# Patient Record
Sex: Female | Born: 1945 | Race: White | Hispanic: No | Smoking: Never smoker
Health system: Southern US, Community
[De-identification: ages and names within clinical notes are randomized; demographics above are authoritative.]

## PROBLEM LIST (undated history)

## (undated) DIAGNOSIS — Z972 Presence of dental prosthetic device (complete) (partial): Secondary | ICD-10-CM

## (undated) DIAGNOSIS — K8020 Calculus of gallbladder without cholecystitis without obstruction: Secondary | ICD-10-CM

## (undated) DIAGNOSIS — K219 Gastro-esophageal reflux disease without esophagitis: Secondary | ICD-10-CM

## (undated) HISTORY — DX: Calculus of gallbladder without cholecystitis without obstruction: K80.20

---

## 1987-07-01 HISTORY — PX: MASTECTOMY: SHX3

## 2015-10-14 ENCOUNTER — Emergency Department: Payer: Medicare Other

## 2015-10-14 ENCOUNTER — Emergency Department
Admission: EM | Admit: 2015-10-14 | Discharge: 2015-10-14 | Disposition: A | Discharge status: Home / Self Care | Payer: Medicare Other | Attending: Emergency Medicine | Admitting: Emergency Medicine

## 2015-10-14 DIAGNOSIS — R112 Nausea with vomiting, unspecified: Secondary | ICD-10-CM | POA: Insufficient documentation

## 2015-10-14 DIAGNOSIS — R1013 Epigastric pain: Secondary | ICD-10-CM | POA: Diagnosis not present

## 2015-10-14 DIAGNOSIS — Z9010 Acquired absence of unspecified breast and nipple: Secondary | ICD-10-CM | POA: Insufficient documentation

## 2015-10-14 DIAGNOSIS — R079 Chest pain, unspecified: Secondary | ICD-10-CM | POA: Diagnosis present

## 2015-10-14 DIAGNOSIS — K8020 Calculus of gallbladder without cholecystitis without obstruction: Secondary | ICD-10-CM | POA: Diagnosis not present

## 2015-10-14 DIAGNOSIS — Z859 Personal history of malignant neoplasm, unspecified: Secondary | ICD-10-CM | POA: Diagnosis not present

## 2015-10-14 LAB — COMPREHENSIVE METABOLIC PANEL
ALK PHOS: 57 U/L (ref 38–126)
ALT: 15 U/L (ref 14–54)
AST: 20 U/L (ref 15–41)
Albumin: 4.5 g/dL (ref 3.5–5.0)
Anion gap: 9 (ref 5–15)
BILIRUBIN TOTAL: 0.6 mg/dL (ref 0.3–1.2)
BUN: 17 mg/dL (ref 6–20)
CALCIUM: 9.4 mg/dL (ref 8.9–10.3)
CHLORIDE: 101 mmol/L (ref 101–111)
CO2: 26 mmol/L (ref 22–32)
CREATININE: 1.15 mg/dL — AB (ref 0.44–1.00)
GFR, EST AFRICAN AMERICAN: 55 mL/min — AB (ref >60)
GFR, EST NON AFRICAN AMERICAN: 47 mL/min — AB (ref >60)
Glucose, Bld: 116 mg/dL — ABNORMAL HIGH (ref 65–99)
Potassium: 3.6 mmol/L (ref 3.5–5.1)
Sodium: 136 mmol/L (ref 135–145)
Total Protein: 7.1 g/dL (ref 6.5–8.1)

## 2015-10-14 LAB — CBC WITH DIFFERENTIAL/PLATELET
BASOS ABS: 0.1 10*3/uL (ref 0–0.1)
BASOS PCT: 1 %
Eosinophils Absolute: 0.1 10*3/uL (ref 0–0.7)
Eosinophils Relative: 1 %
HEMATOCRIT: 41.1 % (ref 35.0–47.0)
HEMOGLOBIN: 13.7 g/dL (ref 12.0–16.0)
LYMPHS PCT: 14 %
Lymphs Abs: 1.5 10*3/uL (ref 1.0–3.6)
MCH: 27.6 pg (ref 26.0–34.0)
MCHC: 33.4 g/dL (ref 32.0–36.0)
MCV: 82.8 fL (ref 80.0–100.0)
Monocytes Absolute: 0.7 10*3/uL (ref 0.2–0.9)
Monocytes Relative: 7 %
NEUTROS ABS: 8.6 10*3/uL — AB (ref 1.4–6.5)
NEUTROS PCT: 77 %
Platelets: 263 10*3/uL (ref 150–440)
RBC: 4.96 MIL/uL (ref 3.80–5.20)
RDW: 13.8 % (ref 11.5–14.5)
WBC: 10.9 10*3/uL (ref 3.6–11.0)

## 2015-10-14 LAB — TROPONIN I

## 2015-10-14 LAB — LIPASE, BLOOD: LIPASE: 34 U/L (ref 11–51)

## 2015-10-14 MED ORDER — BACITRACIN ZINC 500 UNIT/GM EX OINT
TOPICAL_OINTMENT | CUTANEOUS | Status: AC
  Filled 2015-10-14: qty 0.9

## 2015-10-14 MED ORDER — FAMOTIDINE IN NACL 20-0.9 MG/50ML-% IV SOLN
20.0000 mg | Freq: Once | INTRAVENOUS | Status: AC
  Administered 2015-10-14: 20 mg via INTRAVENOUS
  Filled 2015-10-14: qty 50

## 2015-10-14 MED ORDER — ONDANSETRON HCL 4 MG/2ML IJ SOLN
4.0000 mg | Freq: Once | INTRAMUSCULAR | Status: AC
  Administered 2015-10-14: 4 mg via INTRAVENOUS
  Filled 2015-10-14: qty 2

## 2015-10-14 MED ORDER — MORPHINE SULFATE (PF) 2 MG/ML IV SOLN
2.0000 mg | Freq: Once | INTRAVENOUS | Status: AC
  Administered 2015-10-14: 2 mg via INTRAVENOUS
  Filled 2015-10-14: qty 1

## 2015-10-14 MED ORDER — OXYCODONE-ACETAMINOPHEN 5-325 MG PO TABS: 1.0000 | ORAL_TABLET | ORAL | Status: DC | PRN

## 2015-10-14 MED ORDER — ONDANSETRON 4 MG PO TBDP: 4.0000 mg | ORAL_TABLET | Freq: Three times a day (TID) | ORAL | Status: DC | PRN

## 2015-10-14 MED ORDER — OXYCODONE-ACETAMINOPHEN 5-325 MG PO TABS
1.0000 | ORAL_TABLET | Freq: Once | ORAL | Status: AC
  Administered 2015-10-14: 1 via ORAL
  Filled 2015-10-14: qty 1

## 2015-10-14 MED ORDER — ONDANSETRON 4 MG PO TBDP
4.0000 mg | ORAL_TABLET | Freq: Once | ORAL | Status: AC
  Administered 2015-10-14: 4 mg via ORAL
  Filled 2015-10-14: qty 1

## 2015-10-14 NOTE — Discharge Instructions (Signed)
1. Take medicines as needed for pain & nausea (Percocet/Zofran #20). 2. Clear liquids x 12 hours, then bland diet x 1 week, then slowly advance diet as tolerated. Avoid fatty, greasy, spicy foods and drinks. 3. Return to the ER for worsening symptoms, persistent vomiting, fever, difficulty breathing or other concerns.  Abdominal Pain, Adult Many things can cause abdominal pain. Usually, abdominal pain is not caused by a disease and will improve without treatment. It can often be observed and treated at home. Your health care provider will do a physical exam and possibly order blood tests and X-rays to help determine the seriousness of your pain. However, in many cases, more time must pass before a clear cause of the pain can be found. Before that point, your health care provider may not know if you need more testing or further treatment. HOME CARE INSTRUCTIONS Monitor your abdominal pain for any changes. The following actions may help to alleviate any discomfort you are experiencing:  Only take over-the-counter or prescription medicines as directed by your health care provider.  Do not take laxatives unless directed to do so by your health care provider.  Try a clear liquid diet (broth, tea, or water) as directed by your health care provider. Slowly move to a bland diet as tolerated. SEEK MEDICAL CARE IF:  You have unexplained abdominal pain.  You have abdominal pain associated with nausea or diarrhea.  You have pain when you urinate or have a bowel movement.  You experience abdominal pain that wakes you in the night.  You have abdominal pain that is worsened or improved by eating food.  You have abdominal pain that is worsened with eating fatty foods.  You have a fever. SEEK IMMEDIATE MEDICAL CARE IF:  Your pain does not go away within 2 hours.  You keep throwing up (vomiting).  Your pain is felt only in portions of the abdomen, such as the right side or the left lower portion of  the abdomen.  You pass bloody or black tarry stools. MAKE SURE YOU:  Understand these instructions.  Will watch your condition.  Will get help right away if you are not doing well or get worse.   This information is not intended to replace advice given to you by your health care provider. Make sure you discuss any questions you have with your health care provider.   Document Released: 03/26/2005 Document Revised: 03/07/2015 Document Reviewed: 02/23/2013 Elsevier Interactive Patient Education 2016 Elsevier Inc.  Biliary Colic Biliary colic is a pain in the upper abdomen. The pain:  Is usually felt on the right side of the abdomen, but it may also be felt in the center of the abdomen, just below the breastbone (sternum).  May spread back toward the right shoulder blade.  May be steady or irregular.  May be accompanied by nausea and vomiting. Most of the time, the pain goes away in 1-5 hours. After the most intense pain passes, the abdomen may continue to ache mildly for about 24 hours. Biliary colic is caused by a blockage in the bile duct. The bile duct is a pathway that carries bile--a liquid that helps to digest fats--from the gallbladder to the small intestine. Biliary colic usually occurs after eating, when the digestive system demands bile. The pain develops when muscle cells contract forcefully to try to move the blockage so that bile can get by. HOME CARE INSTRUCTIONS  Take medicines only as directed by your health care provider.  Drink enough fluid to  keep your urine clear or pale yellow.  Avoid fatty, greasy, and fried foods. These kinds of foods increase your body's demand for bile.  Avoid any foods that make your pain worse.  Avoid overeating.  Avoid having a large meal after fasting. SEEK MEDICAL CARE IF:  You develop a fever.  Your pain gets worse.  You vomit.  You develop nausea that prevents you from eating and drinking. SEEK IMMEDIATE MEDICAL CARE  IF:  You suddenly develop a fever and shaking chills.  You develop a yellowish discoloration (jaundice) of:  Skin.  Whites of the eyes.  Mucous membranes.  You have continuous or severe pain that is not relieved with medicines.  You have nausea and vomiting that is not relieved with medicines.  You develop dizziness or you faint.   This information is not intended to replace advice given to you by your health care provider. Make sure you discuss any questions you have with your health care provider.   Document Released: 11/17/2005 Document Revised: 10/31/2014 Document Reviewed: 03/28/2014 Elsevier Interactive Patient Education 2016 [employer].  Cholelithiasis Cholelithiasis (also called gallstones) is a form of gallbladder disease in which gallstones form in your gallbladder. The gallbladder is an organ that stores bile made in the liver, which helps digest fats. Gallstones begin as small crystals and slowly grow into stones. Gallstone pain occurs when the gallbladder spasms and a gallstone is blocking the duct. Pain can also occur when a stone passes out of the duct.  RISK FACTORS  Being female.   Having multiple pregnancies. Health care providers sometimes advise removing diseased gallbladders before future pregnancies.   Being obese.  Eating a diet heavy in fried foods and fat.   Being older than 60 years and increasing age.   Prolonged use of medicines containing female hormones.   Having diabetes mellitus.   Rapidly losing weight.   Having a family history of gallstones (heredity).  SYMPTOMS  Nausea.   Vomiting.  Abdominal pain.   Yellowing of the skin (jaundice).   Sudden pain. It may persist from several minutes to several hours.  Fever.   Tenderness to the touch. In some cases, when gallstones do not move into the bile duct, people have no pain or symptoms. These are called "silent" gallstones.  TREATMENT Silent gallstones do not  need treatment. In severe cases, emergency surgery may be required. Options for treatment include:  Surgery to remove the gallbladder. This is the most common treatment.  Medicines. These do not always work and may take 6-12 months or more to work.  Shock wave treatment (extracorporeal biliary lithotripsy). In this treatment an ultrasound machine sends shock waves to the gallbladder to break gallstones into smaller pieces that can pass into the intestines or be dissolved by medicine. HOME CARE INSTRUCTIONS   Only take over-the-counter or prescription medicines for pain, discomfort, or fever as directed by your health care provider.   Follow a low-fat diet until seen again by your health care provider. Fat causes the gallbladder to contract, which can result in pain.   Follow up with your health care provider as directed. Attacks are almost always recurrent and surgery is usually required for permanent treatment.  SEEK IMMEDIATE MEDICAL CARE IF:   Your pain increases and is not controlled by medicines.   You have a fever or persistent symptoms for more than 2-3 days.   You have a fever and your symptoms suddenly get worse.   You have persistent nausea and vomiting.  MAKE SURE YOU:   Understand these instructions.  Will watch your condition.  Will get help right away if you are not doing well or get worse.   This information is not intended to replace advice given to you by your health care provider. Make sure you discuss any questions you have with your health care provider.   Document Released: 06/12/2005 Document Revised: 02/16/2013 Document Reviewed: 12/08/2012 Elsevier Interactive Patient Education 2016 Elsevier Inc.  Nausea and Vomiting Nausea is a sick feeling that often comes before throwing up (vomiting). Vomiting is a reflex where stomach contents come out of your mouth. Vomiting can cause severe loss of body fluids (dehydration). Children and elderly adults can  become dehydrated quickly, especially if they also have diarrhea. Nausea and vomiting are symptoms of a condition or disease. It is important to find the cause of your symptoms. CAUSES   Direct irritation of the stomach lining. This irritation can result from increased acid production (gastroesophageal reflux disease), infection, food poisoning, taking certain medicines (such as nonsteroidal anti-inflammatory drugs), alcohol use, or tobacco use.  Signals from the brain.These signals could be caused by a headache, heat exposure, an inner ear disturbance, increased pressure in the brain from injury, infection, a tumor, or a concussion, pain, emotional stimulus, or metabolic problems.  An obstruction in the gastrointestinal tract (bowel obstruction).  Illnesses such as diabetes, hepatitis, gallbladder problems, appendicitis, kidney problems, cancer, sepsis, atypical symptoms of a heart attack, or eating disorders.  Medical treatments such as chemotherapy and radiation.  Receiving medicine that makes you sleep (general anesthetic) during surgery. DIAGNOSIS Your caregiver may ask for tests to be done if the problems do not improve after a few days. Tests may also be done if symptoms are severe or if the reason for the nausea and vomiting is not clear. Tests may include:  Urine tests.  Blood tests.  Stool tests.  Cultures (to look for evidence of infection).  X-rays or other imaging studies. Test results can help your caregiver make decisions about treatment or the need for additional tests. TREATMENT You need to stay well hydrated. Drink frequently but in small amounts.You may wish to drink water, sports drinks, clear broth, or eat frozen ice pops or gelatin dessert to help stay hydrated.When you eat, eating slowly may help prevent nausea.There are also some antinausea medicines that may help prevent nausea. HOME CARE INSTRUCTIONS   Take all medicine as directed by your caregiver.  If  you do not have an appetite, do not force yourself to eat. However, you must continue to drink fluids.  If you have an appetite, eat a normal diet unless your caregiver tells you differently.  Eat a variety of complex carbohydrates (rice, wheat, potatoes, bread), lean meats, yogurt, fruits, and vegetables.  Avoid high-fat foods because they are more difficult to digest.  Drink enough water and fluids to keep your urine clear or pale yellow.  If you are dehydrated, ask your caregiver for specific rehydration instructions. Signs of dehydration may include:  Severe thirst.  Dry lips and mouth.  Dizziness.  Dark urine.  Decreasing urine frequency and amount.  Confusion.  Rapid breathing or pulse. SEEK IMMEDIATE MEDICAL CARE IF:   You have blood or brown flecks (like coffee grounds) in your vomit.  You have black or bloody stools.  You have a severe headache or stiff neck.  You are confused.  You have severe abdominal pain.  You have chest pain or trouble breathing.  You do not  urinate at least once every 8 hours.  You develop cold or clammy skin.  You continue to vomit for longer than 24 to 48 hours.  You have a fever. MAKE SURE YOU:   Understand these instructions.  Will watch your condition.  Will get help right away if you are not doing well or get worse.   This information is not intended to replace advice given to you by your health care provider. Make sure you discuss any questions you have with your health care provider.   Document Released: 06/16/2005 Document Revised: 09/08/2011 Document Reviewed: 11/13/2010 Elsevier Interactive Patient Education [employer]2016 Elsevier Inc.

## 2015-10-14 NOTE — ED Provider Notes (Signed)
[facility] Emergency Department Provider Note  ____________________________________________  Time seen: Approximately 1:38 AM  I have reviewed the triage vital signs and the nursing notes.   HISTORY  Chief Complaint Chest Pain    HPI Caitlyn Miranda is a 70 y.o. female who presents to the ED from home via EMS with a chief complain of chest pain. Patient was awake and cooking Easter dinner; had eaten chicken fried steak and experienced sudden onset of substernal chest/epigastric burning sensation approximately one hour prior to arrival. This was accompanied by 5 episodes of emesis. No associated symptoms of diaphoresis, shortness of breath, palpitations, dizziness or diarrhea. Denies recent fever, chills, cough, congestion, dysuria. Denies recent travel or trauma. EMS reports arrival BP was 220/102. EMS administered 2 sublingual nitroglycerin which brought patient's blood pressure down to 148/92. Nothing makes patient's pain better or worse.   Past medical history Breast tumor  There are no active problems to display for this patient.   Past Surgical History  Procedure Laterality Date  . Mastectomy Right     No current outpatient prescriptions on file.  Allergies Review of patient's allergies indicates no known allergies.  History reviewed. No pertinent family history.  Social History Social History  Substance Use Topics  . Smoking status: Never Smoker   . Smokeless tobacco: Never Used  . Alcohol Use: Yes     Comment: rarely    Review of Systems  Constitutional: No fever/chills. Eyes: No visual changes. ENT: No sore throat. Cardiovascular: Positive for chest pain. Respiratory: Denies shortness of breath. Gastrointestinal: Positive for abdominal pain, nausea and vomiting.  No diarrhea.  No constipation. Genitourinary: Negative for dysuria. Musculoskeletal: Negative for back pain. Skin: Negative for rash. Neurological: Negative for  headaches, focal weakness or numbness.  10-point ROS otherwise negative.  ____________________________________________   PHYSICAL EXAM:  VITAL SIGNS: ED Triage Vitals  Enc Vitals Group     BP 10/14/15 0124 155/79 mmHg     Pulse Rate 10/14/15 0124 75     Resp 10/14/15 0124 13     Temp 10/14/15 0124 97.8 F (36.6 C)     Temp Source 10/14/15 0124 Oral     SpO2 10/14/15 0117 95 %     Weight 10/14/15 0124 176 lb (79.833 kg)     Height 10/14/15 0124 5\' 3"  (1.6 m)     Head Cir --      Peak Flow --      Pain Score 10/14/15 0126 6     Pain Loc --      Pain Edu? --      Excl. in GC? --     Constitutional: Alert and oriented. Well appearing and in no acute distress. Eyes: Conjunctivae are normal. PERRL. EOMI. Head: Atraumatic. Nose: No congestion/rhinnorhea. Mouth/Throat: Mucous membranes are moist.  Oropharynx non-erythematous. Neck: No stridor.   Cardiovascular: Normal rate, regular rhythm. Grossly normal heart sounds.  Good peripheral circulation. Respiratory: Normal respiratory effort.  No retractions. Lungs CTAB. Gastrointestinal: Soft and mildly tender to palpation epigastrium without rebound or guarding. No distention. No abdominal bruits. No CVA tenderness. Musculoskeletal: No lower extremity tenderness nor edema.  No joint effusions. Neurologic:  Normal speech and language. No gross focal neurologic deficits are appreciated. No gait instability. Skin:  Skin is warm, dry and intact. No rash noted. Psychiatric: Mood and affect are normal. Speech and behavior are normal.  ____________________________________________   LABS (all labs ordered are listed, but only abnormal results are displayed)  Labs Reviewed  CBC WITH DIFFERENTIAL/PLATELET - Abnormal; Notable for the following:    Neutro Abs 8.6 (*)    All other components within normal limits  COMPREHENSIVE METABOLIC PANEL - Abnormal; Notable for the following:    Glucose, Bld 116 (*)    Creatinine, Ser 1.15 (*)     GFR calc non Af Amer 47 (*)    GFR calc Af Amer 55 (*)    All other components within normal limits  TROPONIN I  LIPASE, BLOOD   ____________________________________________  EKG  ED ECG REPORT I, Silverio Hagan J, the attending physician, personally viewed and interpreted this ECG.   Date: 10/14/2015  EKG Time: 0122  Rate: 80  Rhythm: normal EKG, normal sinus rhythm  Axis: Normal  Intervals:none  ST&T Change: Nonspecific  ____________________________________________  RADIOLOGY  Portable CXR (viewed by me, interpreted per Dr. Cherly Hensen): Question of COPD. Mild peribronchial thickening noted. Minimal bibasilar atelectasis seen. Borderline cardiomegaly.  US Abdomen Limited interpreted per Dr. Clovis Riley: Increased hepatic echogenicity, suggesting fatty infiltration. Cholelithiasis. ____________________________________________   PROCEDURES  Procedure(s) performed: None  Critical Care performed: No  ____________________________________________   INITIAL IMPRESSION / ASSESSMENT AND PLAN / ED COURSE  Pertinent labs & imaging results that were available during my care of the patient were reviewed by me and considered in my medical decision making (see chart for details).  70 year old female who presents with substernal chest/epigastric burning discomfort associated with emesis after eating chicken fried steak. Will obtain screening lab work including LFTs and lipase; administer IV analgesia and antiemetic, and obtain right upper quadrant ultrasound to evaluate biliary tree.  ----------------------------------------- 4:01 AM on 10/14/2015 -----------------------------------------  Pain improved after morphine. Updated patient and family members of ultrasound results for cholelithiasis. Plan for analgesia, antiemetic and outpatient follow-up with surgery. Strict return precautions given. All verbalize understanding and agree with plan of  care. ____________________________________________   FINAL CLINICAL IMPRESSION(S) / ED DIAGNOSES  Final diagnoses:  Epigastric pain  Non-intractable vomiting with nausea, vomiting of unspecified type  Calculus of gallbladder without cholecystitis without obstruction      Irean Hong, MD 10/14/15 [phone]

## 2015-10-14 NOTE — ED Notes (Signed)
Patient transported to Ultrasound 

## 2015-10-14 NOTE — ED Notes (Signed)
Patient arrives via EMS with complaints of chest pain in the substernal area.  EMS reports original BP of 220/102 then treated with 2 nitro. Pressure after nitro was 148/92. Reports that pain in now more in the epigasric area. Not diaphoretic. Patient states that she took an [employer] seltzer with no relief.

## 2015-10-14 NOTE — ED Notes (Signed)
Patient stable and ambulatory. Verbalized understanding of the discharge instructions.   

## 2017-07-23 DIAGNOSIS — R69 Illness, unspecified: Secondary | ICD-10-CM | POA: Diagnosis not present

## 2017-07-30 DIAGNOSIS — R69 Illness, unspecified: Secondary | ICD-10-CM | POA: Diagnosis not present

## 2017-08-10 DIAGNOSIS — R69 Illness, unspecified: Secondary | ICD-10-CM | POA: Diagnosis not present

## 2017-08-11 DIAGNOSIS — R69 Illness, unspecified: Secondary | ICD-10-CM | POA: Diagnosis not present

## 2017-08-20 DIAGNOSIS — R69 Illness, unspecified: Secondary | ICD-10-CM | POA: Diagnosis not present

## 2017-08-24 ENCOUNTER — Encounter: Payer: Self-pay | Admitting: Unknown Physician Specialty

## 2017-08-24 ENCOUNTER — Ambulatory Visit (INDEPENDENT_AMBULATORY_CARE_PROVIDER_SITE_OTHER): Payer: Medicare HMO | Admitting: Unknown Physician Specialty

## 2017-08-24 DIAGNOSIS — H6123 Impacted cerumen, bilateral: Secondary | ICD-10-CM | POA: Insufficient documentation

## 2017-08-24 DIAGNOSIS — I10 Essential (primary) hypertension: Secondary | ICD-10-CM

## 2017-08-24 DIAGNOSIS — J301 Allergic rhinitis due to pollen: Secondary | ICD-10-CM | POA: Diagnosis not present

## 2017-08-24 DIAGNOSIS — L658 Other specified nonscarring hair loss: Secondary | ICD-10-CM | POA: Insufficient documentation

## 2017-08-24 DIAGNOSIS — J309 Allergic rhinitis, unspecified: Secondary | ICD-10-CM | POA: Insufficient documentation

## 2017-08-24 MED ORDER — FLUTICASONE PROPIONATE 50 MCG/ACT NA SUSP: 2.0000 | Freq: Every day | NASAL | 6 refills | Status: DC

## 2017-08-24 NOTE — Patient Instructions (Addendum)
DASH Eating Plan DASH stands for "Dietary Approaches to Stop Hypertension." The DASH eating plan is a healthy eating plan that has been shown to reduce high blood pressure (hypertension). It may also reduce your risk for type 2 diabetes, heart disease, and stroke. The DASH eating plan may also help with weight loss. What are tips for following this plan? General guidelines  Avoid eating more than 2,300 mg (milligrams) of salt (sodium) a day. If you have hypertension, you may need to reduce your sodium intake to 1,500 mg a day.  Limit alcohol intake to no more than 1 drink a day for nonpregnant women and 2 drinks a day for men. One drink equals 12 oz of beer, 5 oz of wine, or 1 oz of hard liquor.  Work with your health care provider to maintain a healthy body weight or to lose weight. Ask what an ideal weight is for you.  Get at least 30 minutes of exercise that causes your heart to beat faster (aerobic exercise) most days of the week. Activities may include walking, swimming, or biking.  Work with your health care provider or diet and nutrition specialist (dietitian) to adjust your eating plan to your individual calorie needs. Reading food labels  Check food labels for the amount of sodium per serving. Choose foods with less than 5 percent of the Daily Value of sodium. Generally, foods with less than 300 mg of sodium per serving fit into this eating plan.  To find whole grains, look for the word "whole" as the first word in the ingredient list. Shopping  Buy products labeled as "low-sodium" or "no salt added."  Buy fresh foods. Avoid canned foods and premade or frozen meals. Cooking  Avoid adding salt when cooking. Use salt-free seasonings or herbs instead of table salt or sea salt. Check with your health care provider or pharmacist before using salt substitutes.  Do not fry foods. Cook foods using healthy methods such as baking, boiling, grilling, and broiling instead.  Cook with  heart-healthy oils, such as olive, canola, soybean, or sunflower oil. Meal planning   Eat a balanced diet that includes: ? 5 or more servings of fruits and vegetables each day. At each meal, try to fill half of your plate with fruits and vegetables. ? Up to 6-8 servings of whole grains each day. ? Less than 6 oz of lean meat, poultry, or fish each day. A 3-oz serving of meat is about the same size as a deck of cards. One egg equals 1 oz. ? 2 servings of low-fat dairy each day. ? A serving of nuts, seeds, or beans 5 times each week. ? Heart-healthy fats. Healthy fats called Omega-3 fatty acids are found in foods such as flaxseeds and coldwater fish, like sardines, salmon, and mackerel.  Limit how much you eat of the following: ? Canned or prepackaged foods. ? Food that is high in trans fat, such as fried foods. ? Food that is high in saturated fat, such as fatty meat. ? Sweets, desserts, sugary drinks, and other foods with added sugar. ? Full-fat dairy products.  Do not salt foods before eating.  Try to eat at least 2 vegetarian meals each week.  Eat more home-cooked food and less restaurant, buffet, and fast food.  When eating at a restaurant, ask that your food be prepared with less salt or no salt, if possible. What foods are recommended? The items listed may not be a complete list. Talk with your dietitian about what   dietary choices are best for you. Grains Whole-grain or whole-wheat bread. Whole-grain or whole-wheat pasta. Brown rice. Oatmeal. Quinoa. Bulgur. Whole-grain and low-sodium cereals. Pita bread. Low-fat, low-sodium crackers. Whole-wheat flour tortillas. Vegetables Fresh or frozen vegetables (raw, steamed, roasted, or grilled). Low-sodium or reduced-sodium tomato and vegetable juice. Low-sodium or reduced-sodium tomato sauce and tomato paste. Low-sodium or reduced-sodium canned vegetables. Fruits All fresh, dried, or frozen fruit. Canned fruit in natural juice (without  added sugar). Meat and other protein foods Skinless chicken or turkey. Ground chicken or turkey. Pork with fat trimmed off. Fish and seafood. Egg whites. Dried beans, peas, or lentils. Unsalted nuts, nut butters, and seeds. Unsalted canned beans. Lean cuts of beef with fat trimmed off. Low-sodium, lean deli meat. Dairy Low-fat (1%) or fat-free (skim) milk. Fat-free, low-fat, or reduced-fat cheeses. Nonfat, low-sodium ricotta or cottage cheese. Low-fat or nonfat yogurt. Low-fat, low-sodium cheese. Fats and oils Soft margarine without trans fats. Vegetable oil. Low-fat, reduced-fat, or light mayonnaise and salad dressings (reduced-sodium). Canola, safflower, olive, soybean, and sunflower oils. Avocado. Seasoning and other foods Herbs. Spices. Seasoning mixes without salt. Unsalted popcorn and pretzels. Fat-free sweets. What foods are not recommended? The items listed may not be a complete list. Talk with your dietitian about what dietary choices are best for you. Grains Baked goods made with fat, such as croissants, muffins, or some breads. Dry pasta or rice meal packs. Vegetables Creamed or fried vegetables. Vegetables in a cheese sauce. Regular canned vegetables (not low-sodium or reduced-sodium). Regular canned tomato sauce and paste (not low-sodium or reduced-sodium). Regular tomato and vegetable juice (not low-sodium or reduced-sodium). Pickles. Olives. Fruits Canned fruit in a light or heavy syrup. Fried fruit. Fruit in cream or butter sauce. Meat and other protein foods Fatty cuts of meat. Ribs. Fried meat. Bacon. Sausage. Bologna and other processed lunch meats. Salami. Fatback. Hotdogs. Bratwurst. Salted nuts and seeds. Canned beans with added salt. Canned or smoked fish. Whole eggs or egg yolks. Chicken or turkey with skin. Dairy Whole or 2% milk, cream, and half-and-half. Whole or full-fat cream cheese. Whole-fat or sweetened yogurt. Full-fat cheese. Nondairy creamers. Whipped toppings.  Processed cheese and cheese spreads. Fats and oils Butter. Stick margarine. Lard. Shortening. Ghee. Bacon fat. Tropical oils, such as coconut, palm kernel, or palm oil. Seasoning and other foods Salted popcorn and pretzels. Onion salt, garlic salt, seasoned salt, table salt, and sea salt. Worcestershire sauce. Tartar sauce. Barbecue sauce. Teriyaki sauce. Soy sauce, including reduced-sodium. Steak sauce. Canned and packaged gravies. Fish sauce. Oyster sauce. Cocktail sauce. Horseradish that you find on the shelf. Ketchup. Mustard. Meat flavorings and tenderizers. Bouillon cubes. Hot sauce and Tabasco sauce. Premade or packaged marinades. Premade or packaged taco seasonings. Relishes. Regular salad dressings. Where to find more information:  National Heart, Lung, and Blood Institute: www.nhlbi.nih.gov  American Heart Association: www.heart.org Summary  The DASH eating plan is a healthy eating plan that has been shown to reduce high blood pressure (hypertension). It may also reduce your risk for type 2 diabetes, heart disease, and stroke.  With the DASH eating plan, you should limit salt (sodium) intake to 2,300 mg a day. If you have hypertension, you may need to reduce your sodium intake to 1,500 mg a day.  When on the DASH eating plan, aim to eat more fresh fruits and vegetables, whole grains, lean proteins, low-fat dairy, and heart-healthy fats.  Work with your health care provider or diet and nutrition specialist (dietitian) to adjust your eating plan to your individual   calorie needs. This information is not intended to replace advice given to you by your health care provider. Make sure you discuss any questions you have with your health care provider. Document Released: 06/05/2011 Document Revised: 06/09/2016 Document Reviewed: 06/09/2016 Elsevier Interactive Patient Education  2018 [employer]  Earwax Buildup, Adult The ears produce a substance called earwax that helps keep bacteria  out of the ear and protects the skin in the ear canal. Occasionally, earwax can build up in the ear and cause discomfort or hearing loss. What increases the risk? This condition is more likely to develop in people who:  Are female.  Are elderly.  Naturally produce more earwax.  Clean their ears often with cotton swabs.  Use earplugs often.  Use in-ear headphones often.  Wear hearing aids.  Have narrow ear canals.  Have earwax that is overly thick or sticky.  Have eczema.  Are dehydrated.  Have excess hair in the ear canal.  What are the signs or symptoms? Symptoms of this condition include:  Reduced or muffled hearing.  A feeling of fullness in the ear or feeling that the ear is plugged.  Fluid coming from the ear.  Ear pain.  Ear itch.  Ringing in the ear.  Coughing.  An obvious piece of earwax that can be seen inside the ear canal.  How is this diagnosed? This condition may be diagnosed based on:  Your symptoms.  Your medical history.  An ear exam. During the exam, your health care provider will look into your ear with an instrument called an otoscope.  You may have tests, including a hearing test. How is this treated? This condition may be treated by:  Using ear drops to soften the earwax.  Having the earwax removed by a health care provider. The health care provider may: ? Flush the ear with water. ? Use an instrument that has a loop on the end (curette). ? Use a suction device.  Surgery to remove the wax buildup. This may be done in severe cases.  Follow these instructions at home:  Take over-the-counter and prescription medicines only as told by your health care provider.  Do not put any objects, including cotton swabs, into your ear. You can clean the opening of your ear canal with a washcloth or facial tissue.  Follow instructions from your health care provider about cleaning your ears. Do not over-clean your ears.  Drink enough fluid  to keep your urine clear or pale yellow. This will help to thin the earwax.  Keep all follow-up visits as told by your health care provider. If earwax builds up in your ears often or if you use hearing aids, consider seeing your health care provider for routine, preventive ear cleanings. Ask your health care provider how often you should schedule your cleanings.  If you have hearing aids, clean them according to instructions from the manufacturer and your health care provider. Contact a health care provider if:  You have ear pain.  You develop a fever.  You have blood, pus, or other fluid coming from your ear.  You have hearing loss.  You have ringing in your ears that does not go away.  Your symptoms do not improve with treatment.  You feel like the room is spinning (vertigo). Summary  Earwax can build up in the ear and cause discomfort or hearing loss.  The most common symptoms of this condition include reduced or muffled hearing and a feeling of fullness in the ear or  feeling that the ear is plugged.  This condition may be diagnosed based on your symptoms, your medical history, and an ear exam.  This condition may be treated by using ear drops to soften the earwax or by having the earwax removed by a health care provider.  Do not put any objects, including cotton swabs, into your ear. You can clean the opening of your ear canal with a washcloth or facial tissue. This information is not intended to replace advice given to you by your health care provider. Make sure you discuss any questions you have with your health care provider. Document Released: 07/24/2004 Document Revised: 08/27/2016 Document Reviewed: 08/27/2016 Elsevier Interactive Patient Education  [employer].

## 2017-08-24 NOTE — Assessment & Plan Note (Signed)
Pt states that her BP at home is typically 135-142 at home.  Discussed goal is below 130-80.  Recommended DASH diet.

## 2017-08-24 NOTE — Assessment & Plan Note (Signed)
Discussed using baby oil bilaterally both ears.  Will RTC to irrigate ears.

## 2017-08-24 NOTE — Assessment & Plan Note (Signed)
Discussed using Flonase.  OTC allergy meds prn.

## 2017-08-24 NOTE — Assessment & Plan Note (Signed)
Will check labs.  Discussed using Rogaine.

## 2017-08-24 NOTE — Progress Notes (Signed)
BP (!) 142/84   Pulse 89   Temp 98.2 F (36.8 C) (Oral)   Ht 5' 2.1" (1.577 m)   Wt 192 lb (87.1 kg)   SpO2 98%   BMI 35.00 kg/m    Subjective:    Patient ID: Caitlyn Miranda, female    DOB: Sep 17, 1945, 72 y.o.   MRN: [id number]  HPI: Caitlyn Miranda is a 72 y.o. female  Chief Complaint  Patient presents with  . Establish Care    pt states her left ear has been popping, mouth has been itching, and her hair has been thinning   Ear Fullness   There is pain in both (left worse than right) ears. This is a new problem. The current episode started more than 1 month ago (ever since a bad cold). The problem occurs constantly. The problem has been waxing and waning. There has been no fever. The patient is experiencing no pain. Associated symptoms include coughing and rhinorrhea. Pertinent negatives include no ear discharge, headaches, sore throat or vomiting. Associated symptoms comments: Allergy symptoms starting yesterday. She has tried nothing for the symptoms. The treatment provided no relief.   Hair thinning Noted thinning on head.  Taking Biotin and a MVI.  Noticed in the last month   Social History   Socioeconomic History  . Marital status: Widowed    Spouse name: Not on file  . Number of children: Not on file  . Years of education: Not on file  . Highest education level: Not on file  Social Needs  . Financial resource strain: Not on file  . Food insecurity - worry: Not on file  . Food insecurity - inability: Not on file  . Transportation needs - medical: Not on file  . Transportation needs - non-medical: Not on file  Occupational History  . Not on file  Tobacco Use  . Smoking status: Never Smoker  . Smokeless tobacco: Never Used  Substance and Sexual Activity  . Alcohol use: Yes    Comment: rarely  . Drug use: No  . Sexual activity: Not on file  Other Topics Concern  . Not on file  Social History Narrative  . Not on file   Family History  Problem Relation Age  of Onset  . Hypertension Mother   . Hypertension Father   . Heart disease Father   . Diabetes Father   . Lung disease Father   . Cancer Sister        breast  . Pancreatitis Maternal Grandmother   . Hypertension Sister    Past Medical History:  Diagnosis Date  . Gallstones    Past Surgical History:  Procedure Laterality Date  . MASTECTOMY Right   . MASTECTOMY  1989    Relevant past medical, surgical, family and social history reviewed and updated as indicated. Interim medical history since our last visit reviewed. Allergies and medications reviewed and updated.  Review of Systems  Constitutional: Negative for fatigue.  HENT: Positive for rhinorrhea. Negative for ear discharge and sore throat.        Tickling sensation in mouth.  Comes and goes  Respiratory: Positive for cough.   Cardiovascular: Negative.   Gastrointestinal: Negative.  Negative for vomiting.  Genitourinary: Negative.   Musculoskeletal: Negative.   Neurological: Negative for headaches.  Hematological: Negative.   Psychiatric/Behavioral: Negative.     Per HPI unless specifically indicated above     Objective:    BP (!) 142/84   Pulse 89   Temp 98.2  F (36.8 C) (Oral)   Ht 5' 2.1" (1.577 m)   Wt 192 lb (87.1 kg)   SpO2 98%   BMI 35.00 kg/m   Wt Readings from Last 3 Encounters:  08/24/17 192 lb (87.1 kg)  10/14/15 176 lb (79.8 kg)    Physical Exam  Constitutional: She is oriented to person, place, and time. She appears well-developed and well-nourished. No distress.  HENT:  Head: Normocephalic and atraumatic.  Bilateral ear wax  Eyes: Conjunctivae and lids are normal. Right eye exhibits no discharge. Left eye exhibits no discharge. No scleral icterus.  Neck: Normal range of motion. Neck supple. No JVD present. Carotid bruit is not present.  Cardiovascular: Normal rate, regular rhythm and normal heart sounds.  Pulmonary/Chest: Effort normal and breath sounds normal.  Abdominal: Normal  appearance. There is no splenomegaly or hepatomegaly.  Musculoskeletal: Normal range of motion.  Neurological: She is alert and oriented to person, place, and time.  Skin: Skin is warm, dry and intact. No rash noted. No pallor.  Notable hair loss top of head  Psychiatric: She has a normal mood and affect. Her behavior is normal. Judgment and thought content normal.    Results for orders placed or performed during the hospital encounter of 10/14/15  CBC with Differential  Result Value Ref Range   WBC 10.9 3.6 - 11.0 K/uL   RBC 4.96 3.80 - 5.20 MIL/uL   Hemoglobin 13.7 12.0 - 16.0 g/dL   HCT 40.941.1 81.135.0 - 91.447.0 %   MCV 82.8 80.0 - 100.0 fL   MCH 27.6 26.0 - 34.0 pg   MCHC 33.4 32.0 - 36.0 g/dL   RDW 78.213.8 95.611.5 - 21.314.5 %   Platelets 263 150 - 440 K/uL   Neutrophils Relative % 77 %   Neutro Abs 8.6 (H) 1.4 - 6.5 K/uL   Lymphocytes Relative 14 %   Lymphs Abs 1.5 1.0 - 3.6 K/uL   Monocytes Relative 7 %   Monocytes Absolute 0.7 0.2 - 0.9 K/uL   Eosinophils Relative 1 %   Eosinophils Absolute 0.1 0 - 0.7 K/uL   Basophils Relative 1 %   Basophils Absolute 0.1 0 - 0.1 K/uL  Comprehensive metabolic panel  Result Value Ref Range   Sodium 136 135 - 145 mmol/L   Potassium 3.6 3.5 - 5.1 mmol/L   Chloride 101 101 - 111 mmol/L   CO2 26 22 - 32 mmol/L   Glucose, Bld 116 (H) 65 - 99 mg/dL   BUN 17 6 - 20 mg/dL   Creatinine, Ser 0.861.15 (H) 0.44 - 1.00 mg/dL   Calcium 9.4 8.9 - 57.810.3 mg/dL   Total Protein 7.1 6.5 - 8.1 g/dL   Albumin 4.5 3.5 - 5.0 g/dL   AST 20 15 - 41 U/L   ALT 15 14 - 54 U/L   Alkaline Phosphatase 57 38 - 126 U/L   Total Bilirubin 0.6 0.3 - 1.2 mg/dL   GFR calc non Af Amer 47 (L) >60 mL/min   GFR calc Af Amer 55 (L) >60 mL/min   Anion gap 9 5 - 15  Troponin I  Result Value Ref Range   Troponin I <0.03 <0.031 ng/mL  Lipase, blood  Result Value Ref Range   Lipase 34 11 - 51 U/L      Assessment & Plan:   Problem List Items Addressed This Visit      Unprioritized    Allergic rhinitis    Discussed using Flonase.  OTC allergy meds prn.  Excessive ear wax, bilateral    Discussed using baby oil bilaterally both ears.  Will RTC to irrigate ears.        Female pattern hair loss    Will check labs.  Discussed using Rogaine.        Relevant Orders   CBC with Differential/Platelet   TSH   Hypertension    Pt states that her BP at home is typically 135-142 at home.  Discussed goal is below 130-80.  Recommended DASH diet.        Relevant Orders   CBC with Differential/Platelet   Comprehensive metabolic panel       Follow up plan: Return in about 1 week (around 08/31/2017) for ear wax removal.

## 2017-08-25 ENCOUNTER — Encounter: Payer: Self-pay | Admitting: Unknown Physician Specialty

## 2017-08-25 LAB — CBC WITH DIFFERENTIAL/PLATELET
BASOS: 0 %
Basophils Absolute: 0 10*3/uL (ref 0.0–0.2)
EOS (ABSOLUTE): 0.2 10*3/uL (ref 0.0–0.4)
EOS: 3 %
HEMATOCRIT: 44.7 % (ref 34.0–46.6)
HEMOGLOBIN: 14.6 g/dL (ref 11.1–15.9)
IMMATURE GRANS (ABS): 0 10*3/uL (ref 0.0–0.1)
IMMATURE GRANULOCYTES: 0 %
LYMPHS: 33 %
Lymphocytes Absolute: 2.1 10*3/uL (ref 0.7–3.1)
MCH: 28.1 pg (ref 26.6–33.0)
MCHC: 32.7 g/dL (ref 31.5–35.7)
MCV: 86 fL (ref 79–97)
MONOCYTES: 10 %
Monocytes Absolute: 0.6 10*3/uL (ref 0.1–0.9)
NEUTROS PCT: 54 %
Neutrophils Absolute: 3.4 10*3/uL (ref 1.4–7.0)
Platelets: 323 10*3/uL (ref 150–379)
RBC: 5.19 x10E6/uL (ref 3.77–5.28)
RDW: 14.1 % (ref 12.3–15.4)
WBC: 6.3 10*3/uL (ref 3.4–10.8)

## 2017-08-25 LAB — COMPREHENSIVE METABOLIC PANEL
ALT: 17 IU/L (ref 0–32)
AST: 18 IU/L (ref 0–40)
Albumin/Globulin Ratio: 2.1 (ref 1.2–2.2)
Albumin: 4.7 g/dL (ref 3.5–4.8)
Alkaline Phosphatase: 61 IU/L (ref 39–117)
BUN/Creatinine Ratio: 14 (ref 12–28)
BUN: 12 mg/dL (ref 8–27)
Bilirubin Total: 0.4 mg/dL (ref 0.0–1.2)
CALCIUM: 9.8 mg/dL (ref 8.7–10.3)
CO2: 22 mmol/L (ref 20–29)
CREATININE: 0.85 mg/dL (ref 0.57–1.00)
Chloride: 102 mmol/L (ref 96–106)
GFR, EST AFRICAN AMERICAN: 80 mL/min/{1.73_m2} (ref >59)
GFR, EST NON AFRICAN AMERICAN: 69 mL/min/{1.73_m2} (ref >59)
GLOBULIN, TOTAL: 2.2 g/dL (ref 1.5–4.5)
Glucose: 99 mg/dL (ref 65–99)
Potassium: 4.3 mmol/L (ref 3.5–5.2)
SODIUM: 143 mmol/L (ref 134–144)
TOTAL PROTEIN: 6.9 g/dL (ref 6.0–8.5)

## 2017-08-25 LAB — TSH: TSH: 6.89 u[IU]/mL — ABNORMAL HIGH (ref 0.450–4.500)

## 2017-09-01 ENCOUNTER — Ambulatory Visit: Payer: Medicare HMO | Admitting: Unknown Physician Specialty

## 2017-09-11 ENCOUNTER — Ambulatory Visit: Payer: Medicare HMO | Admitting: Unknown Physician Specialty

## 2017-09-16 ENCOUNTER — Ambulatory Visit (INDEPENDENT_AMBULATORY_CARE_PROVIDER_SITE_OTHER): Payer: Medicare HMO | Admitting: Unknown Physician Specialty

## 2017-09-16 ENCOUNTER — Encounter: Payer: Self-pay | Admitting: Unknown Physician Specialty

## 2017-09-16 VITALS — BP 154/95 | HR 101 | Temp 98.1°F | Ht 62.0 in | Wt 189.5 lb

## 2017-09-16 DIAGNOSIS — R7989 Other specified abnormal findings of blood chemistry: Secondary | ICD-10-CM

## 2017-09-16 DIAGNOSIS — H6123 Impacted cerumen, bilateral: Secondary | ICD-10-CM | POA: Diagnosis not present

## 2017-09-16 DIAGNOSIS — I10 Essential (primary) hypertension: Secondary | ICD-10-CM

## 2017-09-16 NOTE — Assessment & Plan Note (Signed)
Improving with Debrox

## 2017-09-16 NOTE — Assessment & Plan Note (Addendum)
Not to goal.  Pt wants to work on diet and exercise.  Discussed with pt benefits of BP meds to prevent end organ damage.

## 2017-09-16 NOTE — Progress Notes (Signed)
BP (!) 154/95   Pulse (!) 101   Temp 98.1 F (36.7 C) (Oral)   Ht 5\' 2"  (1.575 m)   Wt 189 lb 8 oz (86 kg)   SpO2 97%   BMI 34.66 kg/m    Subjective:    Patient ID: Caitlyn Miranda Lazar, female    DOB: 02-04-1946, 72 y.o.   MRN: [id number]  HPI: Caitlyn Miranda Co is a 72 y.o. female  Chief Complaint  Patient presents with  . Follow-up   Ear wax Hearing better after using Debrox  Hypertension Stays about what it is here today.  Does not want to take medications at this time.  States it is about what it is here.  States she "feels great."  No problems or lightheadedness No chest pain with exertion or shortness of breath No Edema  Elevated TSH Asymptomatic though some hair loss  Relevant past medical, surgical, family and social history reviewed and updated as indicated. Interim medical history since our last visit reviewed. Allergies and medications reviewed and updated.  Review of Systems  Per HPI unless specifically indicated above     Objective:    BP (!) 154/95   Pulse (!) 101   Temp 98.1 F (36.7 C) (Oral)   Ht 5\' 2"  (1.575 m)   Wt 189 lb 8 oz (86 kg)   SpO2 97%   BMI 34.66 kg/m   Wt Readings from Last 3 Encounters:  09/16/17 189 lb 8 oz (86 kg)  08/24/17 192 lb (87.1 kg)  10/14/15 176 lb (79.8 kg)    Physical Exam  Constitutional: She is oriented to person, place, and time. She appears well-developed and well-nourished. No distress.  HENT:  Head: Normocephalic and atraumatic.  Earwax left side.  Some ear canal irritation  Eyes: Conjunctivae and lids are normal. Right eye exhibits no discharge. Left eye exhibits no discharge. No scleral icterus.  Neck: Normal range of motion. Neck supple. No JVD present. Carotid bruit is not present.  Cardiovascular: Normal rate, regular rhythm and normal heart sounds.  Pulmonary/Chest: Effort normal and breath sounds normal.  Abdominal: Normal appearance. There is no splenomegaly or hepatomegaly.  Musculoskeletal:  Normal range of motion.  Neurological: She is alert and oriented to person, place, and time.  Skin: Skin is warm, dry and intact. No rash noted. No pallor.  Psychiatric: She has a normal mood and affect. Her behavior is normal. Judgment and thought content normal.    Results for orders placed or performed in visit on 08/24/17  CBC with Differential/Platelet  Result Value Ref Range   WBC 6.3 3.4 - 10.8 x10E3/uL   RBC 5.19 3.77 - 5.28 x10E6/uL   Hemoglobin 14.6 11.1 - 15.9 g/dL   Hematocrit 40.944.7 81.134.0 - 46.6 %   MCV 86 79 - 97 fL   MCH 28.1 26.6 - 33.0 pg   MCHC 32.7 31.5 - 35.7 g/dL   RDW 91.414.1 78.212.3 - 95.615.4 %   Platelets 323 150 - 379 x10E3/uL   Neutrophils 54 Not Estab. %   Lymphs 33 Not Estab. %   Monocytes 10 Not Estab. %   Eos 3 Not Estab. %   Basos 0 Not Estab. %   Neutrophils Absolute 3.4 1.4 - 7.0 x10E3/uL   Lymphocytes Absolute 2.1 0.7 - 3.1 x10E3/uL   Monocytes Absolute 0.6 0.1 - 0.9 x10E3/uL   EOS (ABSOLUTE) 0.2 0.0 - 0.4 x10E3/uL   Basophils Absolute 0.0 0.0 - 0.2 x10E3/uL   Immature Granulocytes 0 Not Estab. %  Immature Grans (Abs) 0.0 0.0 - 0.1 x10E3/uL  Comprehensive metabolic panel  Result Value Ref Range   Glucose 99 65 - 99 mg/dL   BUN 12 8 - 27 mg/dL   Creatinine, Ser 1.61 0.57 - 1.00 mg/dL   GFR calc non Af Amer 69 >59 mL/min/1.73   GFR calc Af Amer 80 >59 mL/min/1.73   BUN/Creatinine Ratio 14 12 - 28   Sodium 143 134 - 144 mmol/L   Potassium 4.3 3.5 - 5.2 mmol/L   Chloride 102 96 - 106 mmol/L   CO2 22 20 - 29 mmol/L   Calcium 9.8 8.7 - 10.3 mg/dL   Total Protein 6.9 6.0 - 8.5 g/dL   Albumin 4.7 3.5 - 4.8 g/dL   Globulin, Total 2.2 1.5 - 4.5 g/dL   Albumin/Globulin Ratio 2.1 1.2 - 2.2   Bilirubin Total 0.4 0.0 - 1.2 mg/dL   Alkaline Phosphatase 61 39 - 117 IU/L   AST 18 0 - 40 IU/L   ALT 17 0 - 32 IU/L  TSH  Result Value Ref Range   TSH 6.890 (H) 0.450 - 4.500 uIU/mL      Assessment & Plan:   Problem List Items Addressed This Visit       Unprioritized   Elevated TSH - Primary    Last TSH was 6.5.  Pt feels fine.  Sill order a TSH for the next 3-6 months      Relevant Orders   TSH   Thyroid Panel With TSH   Excessive ear wax, bilateral    Improving with Debrox      Hypertension    Not to goal.  Pt wants to work on diet and exercise.  Discussed with pt benefits of BP meds to prevent end organ damage.             Follow up plan: Return in about 6 months (around 03/19/2018) for physical.

## 2017-09-16 NOTE — Assessment & Plan Note (Signed)
Last TSH was 6.5.  Pt feels fine.  Sill order a TSH for the next 3-6 months

## 2017-11-30 DIAGNOSIS — H60393 Other infective otitis externa, bilateral: Secondary | ICD-10-CM | POA: Diagnosis not present

## 2017-12-01 ENCOUNTER — Ambulatory Visit: Payer: Medicare HMO | Admitting: Family Medicine

## 2018-01-12 ENCOUNTER — Encounter: Payer: Self-pay | Admitting: Unknown Physician Specialty

## 2018-01-12 ENCOUNTER — Ambulatory Visit (INDEPENDENT_AMBULATORY_CARE_PROVIDER_SITE_OTHER): Payer: Medicare HMO | Admitting: Unknown Physician Specialty

## 2018-01-12 VITALS — BP 137/80 | HR 82 | Temp 97.9°F | Ht 62.0 in | Wt 189.0 lb

## 2018-01-12 DIAGNOSIS — R7989 Other specified abnormal findings of blood chemistry: Secondary | ICD-10-CM

## 2018-01-12 DIAGNOSIS — R946 Abnormal results of thyroid function studies: Secondary | ICD-10-CM | POA: Diagnosis not present

## 2018-01-12 DIAGNOSIS — H9193 Unspecified hearing loss, bilateral: Secondary | ICD-10-CM | POA: Diagnosis not present

## 2018-01-12 DIAGNOSIS — H6123 Impacted cerumen, bilateral: Secondary | ICD-10-CM | POA: Diagnosis not present

## 2018-01-12 DIAGNOSIS — H9190 Unspecified hearing loss, unspecified ear: Secondary | ICD-10-CM | POA: Insufficient documentation

## 2018-01-12 NOTE — Assessment & Plan Note (Addendum)
Noted last visit but may have been a Biotin reaction.  Will recheck labs today.  Stopped Biotin supplement 2 weeks ago

## 2018-01-12 NOTE — Progress Notes (Signed)
BP 137/80   Pulse 82   Temp 97.9 F (36.6 C) (Oral)   Ht 5\' 2"  (1.575 m)   Wt 189 lb (85.7 kg)   SpO2 95%   BMI 34.57 kg/m    Subjective:    Patient ID: Caitlyn Miranda, female    DOB: 03/14/1946, 72 y.o.   MRN: [id number]  HPI: Caitlyn Miranda is a 72 y.o. female  Chief Complaint  Patient presents with  . Annual Exam    pt states she is not here for a physical check up   Pt with no concerns besides bilateral ears feeling stopped up and having trouble hearing.  Did "pop" yesterday and symptoms improved but hearing still decreased.    BP is improved today. States this is typical of her home numbers.    F/U elevated TSH.  No complaints of fatigue.    Relevant past medical, surgical, family and social history reviewed and updated as indicated. Interim medical history since our last visit reviewed. Allergies and medications reviewed and updated.  Review of Systems  Per HPI unless specifically indicated above     Objective:    BP 137/80   Pulse 82   Temp 97.9 F (36.6 C) (Oral)   Ht 5\' 2"  (1.575 m)   Wt 189 lb (85.7 kg)   SpO2 95%   BMI 34.57 kg/m   Wt Readings from Last 3 Encounters:  01/12/18 189 lb (85.7 kg)  09/16/17 189 lb 8 oz (86 kg)  08/24/17 192 lb (87.1 kg)    Physical Exam  Constitutional: She is oriented to person, place, and time. She appears well-developed and well-nourished.  HENT:  Head: Normocephalic and atraumatic.  Bilateral small ear canals.  Cannot visualize TM even with irrigation  Eyes: Pupils are equal, round, and reactive to light. Right eye exhibits no discharge. Left eye exhibits no discharge. No scleral icterus.  Neck: Normal range of motion. Neck supple. Carotid bruit is not present. No thyromegaly present.  Cardiovascular: Normal rate, regular rhythm and normal heart sounds. Exam reveals no gallop and no friction rub.  No murmur heard. Pulmonary/Chest: Effort normal and breath sounds normal. No respiratory distress. She has no  wheezes. She has no rales. No breast tenderness or discharge.  Abdominal: Soft. Bowel sounds are normal. There is no tenderness. There is no rebound.  Genitourinary: No breast tenderness or discharge.  Musculoskeletal: Normal range of motion.  Lymphadenopathy:    She has no cervical adenopathy.  Neurological: She is alert and oriented to person, place, and time.  Skin: Skin is warm, dry and intact. No rash noted.  Psychiatric: She has a normal mood and affect. Her speech is normal and behavior is normal. Judgment and thought content normal. Cognition and memory are normal.    Results for orders placed or performed in visit on 08/24/17  CBC with Differential/Platelet  Result Value Ref Range   WBC 6.3 3.4 - 10.8 x10E3/uL   RBC 5.19 3.77 - 5.28 x10E6/uL   Hemoglobin 14.6 11.1 - 15.9 g/dL   Hematocrit 40.9 81.1 - 46.6 %   MCV 86 79 - 97 fL   MCH 28.1 26.6 - 33.0 pg   MCHC 32.7 31.5 - 35.7 g/dL   RDW 91.4 78.2 - 95.6 %   Platelets 323 150 - 379 x10E3/uL   Neutrophils 54 Not Estab. %   Lymphs 33 Not Estab. %   Monocytes 10 Not Estab. %   Eos 3 Not Estab. %   Basos 0  Not Estab. %   Neutrophils Absolute 3.4 1.4 - 7.0 x10E3/uL   Lymphocytes Absolute 2.1 0.7 - 3.1 x10E3/uL   Monocytes Absolute 0.6 0.1 - 0.9 x10E3/uL   EOS (ABSOLUTE) 0.2 0.0 - 0.4 x10E3/uL   Basophils Absolute 0.0 0.0 - 0.2 x10E3/uL   Immature Granulocytes 0 Not Estab. %   Immature Grans (Abs) 0.0 0.0 - 0.1 x10E3/uL  Comprehensive metabolic panel  Result Value Ref Range   Glucose 99 65 - 99 mg/dL   BUN 12 8 - 27 mg/dL   Creatinine, Ser 1.610.85 0.57 - 1.00 mg/dL   GFR calc non Af Amer 69 >59 mL/min/1.73   GFR calc Af Amer 80 >59 mL/min/1.73   BUN/Creatinine Ratio 14 12 - 28   Sodium 143 134 - 144 mmol/L   Potassium 4.3 3.5 - 5.2 mmol/L   Chloride 102 96 - 106 mmol/L   CO2 22 20 - 29 mmol/L   Calcium 9.8 8.7 - 10.3 mg/dL   Total Protein 6.9 6.0 - 8.5 g/dL   Albumin 4.7 3.5 - 4.8 g/dL   Globulin, Total 2.2 1.5 - 4.5  g/dL   Albumin/Globulin Ratio 2.1 1.2 - 2.2   Bilirubin Total 0.4 0.0 - 1.2 mg/dL   Alkaline Phosphatase 61 39 - 117 IU/L   AST 18 0 - 40 IU/L   ALT 17 0 - 32 IU/L  TSH  Result Value Ref Range   TSH 6.890 (H) 0.450 - 4.500 uIU/mL      Assessment & Plan:   Problem List Items Addressed This Visit      Unprioritized   Decreased hearing   Relevant Orders   Ambulatory referral to ENT   Elevated TSH    Noted last visit but may have been a Biotin reaction.  Will recheck labs today.  Stopped Biotin supplement 2 weeks ago      Excessive ear wax, bilateral - Primary   Relevant Orders   Ambulatory referral to ENT       Follow up plan: Return if symptoms worsen or fail to improve.

## 2018-01-13 ENCOUNTER — Encounter: Payer: Self-pay | Admitting: Unknown Physician Specialty

## 2018-01-13 LAB — THYROID PANEL WITH TSH
FREE THYROXINE INDEX: 1.2 (ref 1.2–4.9)
T3 Uptake Ratio: 20 % — ABNORMAL LOW (ref 24–39)
T4, Total: 6.1 ug/dL (ref 4.5–12.0)
TSH: 5.17 u[IU]/mL — AB (ref 0.450–4.500)

## 2018-01-22 DIAGNOSIS — H6060 Unspecified chronic otitis externa, unspecified ear: Secondary | ICD-10-CM | POA: Diagnosis not present

## 2018-01-22 DIAGNOSIS — H6122 Impacted cerumen, left ear: Secondary | ICD-10-CM | POA: Diagnosis not present

## 2018-01-22 DIAGNOSIS — H6980 Other specified disorders of Eustachian tube, unspecified ear: Secondary | ICD-10-CM | POA: Diagnosis not present

## 2018-01-22 DIAGNOSIS — H6063 Unspecified chronic otitis externa, bilateral: Secondary | ICD-10-CM | POA: Diagnosis not present

## 2018-02-01 DIAGNOSIS — H6122 Impacted cerumen, left ear: Secondary | ICD-10-CM | POA: Diagnosis not present

## 2018-02-01 DIAGNOSIS — H6060 Unspecified chronic otitis externa, unspecified ear: Secondary | ICD-10-CM | POA: Diagnosis not present

## 2019-06-09 DIAGNOSIS — R69 Illness, unspecified: Secondary | ICD-10-CM | POA: Diagnosis not present

## 2019-06-16 DIAGNOSIS — R69 Illness, unspecified: Secondary | ICD-10-CM | POA: Diagnosis not present

## 2019-08-29 DIAGNOSIS — H2513 Age-related nuclear cataract, bilateral: Secondary | ICD-10-CM | POA: Diagnosis not present

## 2019-08-29 DIAGNOSIS — H04129 Dry eye syndrome of unspecified lacrimal gland: Secondary | ICD-10-CM | POA: Diagnosis not present

## 2019-08-29 DIAGNOSIS — H1045 Other chronic allergic conjunctivitis: Secondary | ICD-10-CM | POA: Diagnosis not present

## 2019-12-20 DIAGNOSIS — R69 Illness, unspecified: Secondary | ICD-10-CM | POA: Diagnosis not present

## 2021-01-21 DIAGNOSIS — H04129 Dry eye syndrome of unspecified lacrimal gland: Secondary | ICD-10-CM | POA: Diagnosis not present

## 2021-01-21 DIAGNOSIS — H2510 Age-related nuclear cataract, unspecified eye: Secondary | ICD-10-CM | POA: Diagnosis not present

## 2021-01-21 DIAGNOSIS — H1045 Other chronic allergic conjunctivitis: Secondary | ICD-10-CM | POA: Diagnosis not present

## 2021-07-05 ENCOUNTER — Ambulatory Visit: Payer: Self-pay | Admitting: *Deleted
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
## 2021-07-05 NOTE — Telephone Encounter (Signed)
Pt thinks she may have bronchitis, her throat is red. She keeps getting laryngitis. Pt has not been seen since 2019.  Informed pt that she would be considered "new" and we do not do any new acute appts.  She said the old Dr Dossie Arbour was her dr  ** The message may be incomplete. It was sent as a result of a timeout. **  Reason for Disposition  [1] Sore throat with cough/cold symptoms AND [2] present > 5 days  Answer Assessment - Initial Assessment Questions 1. ONSET: "When did the throat start hurting?" (Hours or days ago)      12/24-Saturday- irritation got worse 2. SEVERITY: "How bad is the sore throat?" (Scale 1-10; mild, moderate or severe)   - MILD (1-3):  doesn't interfere with eating or normal activities   - MODERATE (4-7): interferes with eating some solids and normal activities   - SEVERE (8-10):  excruciating pain, interferes with most normal activities   - SEVERE DYSPHAGIA: can't swallow liquids, drooling     mild 3. STREP EXPOSURE: "Has there been any exposure to strep within the past week?" If Yes, ask: "What type of contact occurred?"      no 4.  VIRAL SYMPTOMS: "Are there any symptoms of a cold, such as a runny nose, cough, hoarse voice or red eyes?"      Sinus drainage 5. FEVER: "Do you have a fever?" If Yes, ask: "What is your temperature, how was it measured, and when did it start?"     no 6. PUS ON THE TONSILS: "Is there pus on the tonsils in the back of your throat?"     no 7. OTHER SYMPTOMS: "Do you have any other symptoms?" (e.g., difficulty breathing, headache, rash)     Fatigued 8. PREGNANCY: "Is there any chance you are pregnant?" "When was your last menstrual period?"     *No Answer*  Protocols used: Sore Throat-A-AH

## 2021-07-15 DIAGNOSIS — H2513 Age-related nuclear cataract, bilateral: Secondary | ICD-10-CM | POA: Diagnosis not present

## 2021-07-30 ENCOUNTER — Emergency Department
Admission: EM | Admit: 2021-07-30 | Discharge: 2021-07-30 | Disposition: A | Discharge status: Home / Self Care | Payer: Medicare HMO | Attending: Emergency Medicine | Admitting: Emergency Medicine

## 2021-07-30 ENCOUNTER — Emergency Department: Payer: Medicare HMO

## 2021-07-30 ENCOUNTER — Other Ambulatory Visit: Payer: Self-pay

## 2021-07-30 DIAGNOSIS — K8020 Calculus of gallbladder without cholecystitis without obstruction: Secondary | ICD-10-CM | POA: Diagnosis not present

## 2021-07-30 DIAGNOSIS — R0602 Shortness of breath: Secondary | ICD-10-CM

## 2021-07-30 DIAGNOSIS — Z20822 Contact with and (suspected) exposure to covid-19: Secondary | ICD-10-CM | POA: Insufficient documentation

## 2021-07-30 DIAGNOSIS — H2511 Age-related nuclear cataract, right eye: Secondary | ICD-10-CM | POA: Diagnosis not present

## 2021-07-30 DIAGNOSIS — K449 Diaphragmatic hernia without obstruction or gangrene: Secondary | ICD-10-CM | POA: Diagnosis not present

## 2021-07-30 DIAGNOSIS — R000 Tachycardia, unspecified: Secondary | ICD-10-CM | POA: Diagnosis not present

## 2021-07-30 DIAGNOSIS — I700 Atherosclerosis of aorta: Secondary | ICD-10-CM | POA: Diagnosis not present

## 2021-07-30 DIAGNOSIS — J0100 Acute maxillary sinusitis, unspecified: Secondary | ICD-10-CM | POA: Diagnosis not present

## 2021-07-30 DIAGNOSIS — J984 Other disorders of lung: Secondary | ICD-10-CM | POA: Diagnosis not present

## 2021-07-30 DIAGNOSIS — J9811 Atelectasis: Secondary | ICD-10-CM | POA: Diagnosis not present

## 2021-07-30 LAB — BASIC METABOLIC PANEL
Anion gap: 11 (ref 5–15)
BUN: 13 mg/dL (ref 8–23)
CO2: 23 mmol/L (ref 22–32)
Calcium: 9.3 mg/dL (ref 8.9–10.3)
Chloride: 103 mmol/L (ref 98–111)
Creatinine, Ser: 0.95 mg/dL (ref 0.44–1.00)
GFR, Estimated: >60 mL/min (ref >60)
Glucose, Bld: 103 mg/dL — ABNORMAL HIGH (ref 70–99)
Potassium: 3.7 mmol/L (ref 3.5–5.1)
Sodium: 137 mmol/L (ref 135–145)

## 2021-07-30 LAB — CBC
HCT: 45.1 % (ref 36.0–46.0)
Hemoglobin: 14.3 g/dL (ref 12.0–15.0)
MCH: 27.7 pg (ref 26.0–34.0)
MCHC: 31.7 g/dL (ref 30.0–36.0)
MCV: 87.4 fL (ref 80.0–100.0)
Platelets: 367 10*3/uL (ref 150–400)
RBC: 5.16 MIL/uL — ABNORMAL HIGH (ref 3.87–5.11)
RDW: 13.4 % (ref 11.5–15.5)
WBC: 9.3 10*3/uL (ref 4.0–10.5)
nRBC: 0 % (ref 0.0–0.2)

## 2021-07-30 LAB — RESP PANEL BY RT-PCR (FLU A&B, COVID) ARPGX2
Influenza A by PCR: NEGATIVE
Influenza B by PCR: NEGATIVE
SARS Coronavirus 2 by RT PCR: NEGATIVE

## 2021-07-30 LAB — TROPONIN I (HIGH SENSITIVITY): Troponin I (High Sensitivity): <2 ng/L (ref <18)

## 2021-07-30 MED ORDER — IOHEXOL 350 MG/ML SOLN
75.0000 mL | Freq: Once | INTRAVENOUS | Status: AC | PRN
Start: 2021-07-30 — End: 2021-07-30
  Administered 2021-07-30: 75 mL via INTRAVENOUS
  Filled 2021-07-30: qty 75

## 2021-07-30 MED ORDER — AMOXICILLIN-POT CLAVULANATE 875-125 MG PO TABS: 1.0000 | ORAL_TABLET | Freq: Two times a day (BID) | ORAL | 0 refills | Status: AC

## 2021-07-30 NOTE — Discharge Instructions (Addendum)
Your CT scan is as below.  Please call the cardiology and doctor to get follow-up and they can recheck your blood pressure there.  Return to the ER if develop worsening symptoms or any other concerns.  We are starting you on a course antibiotics for possible sinusitis  IMPRESSION: 1. No evidence of pulmonary embolus. 2. No acute intrathoracic process. 3. Dilated main pulmonary artery consistent with pulmonary arterial hypertension. 4. Cholelithiasis without cholecystitis. 5. Small hiatal hernia. 6.  Aortic Atherosclerosis (ICD10-I70.0).
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
## 2021-07-31 ENCOUNTER — Encounter: Payer: Self-pay | Admitting: Ophthalmology

## 2021-08-06 ENCOUNTER — Ambulatory Visit: Payer: Self-pay | Admitting: *Deleted

## 2021-08-06 DIAGNOSIS — T7840XA Allergy, unspecified, initial encounter: Secondary | ICD-10-CM | POA: Diagnosis not present

## 2021-08-06 NOTE — Telephone Encounter (Signed)
Tried calling pt to inform her that she is unable to be seen here due to not being a pt at [facility], there was no answer and no option for VM. Malachy Mood will be back at [facility] next week to cover.

## 2021-08-06 NOTE — Telephone Encounter (Signed)
Per agent pt is an established pt with [facility] but is requesting to see [name], NP.   Caitlyn Miranda is working her last day at [employer]  today.  Pt has called into [facility] to see if she can see [name], NP there though she is not established with [facility].  She feels like she is having a reaction to dye from an eye procedure.   She hung up before being transferred to me.   I attempted to call her back but the line was busy.    She was wanting to know should she go on to the ED if can't be seen by [facility].   I forwarded this to [facility] to see if pt could see [name] there or not. Pt called from [phone].

## 2021-08-06 NOTE — Telephone Encounter (Signed)
°  Chief Complaint: itching after CT scan Symptoms: itching, rash Frequency: started next day Pertinent Negatives:  Disposition: [] ED /[x] Urgent Care (no appt availability in office) / [] Appointment(In office/virtual)/ []  [place] [facility]/ [] Home Care/ [] Refused Recommended Disposition /[]  Mobile Bus/ []  Follow-up with PCP Additional Notes: Patient does not have PCP- it has been over 3 years

## 2021-08-06 NOTE — Telephone Encounter (Signed)
Reason for Disposition  [1] MODERATE-SEVERE widespread itching (i.e., interferes with sleep, normal activities or school) AND [2] not improved after 24 hours of itching Care Advice  Answer Assessment - Initial Assessment Questions 1. DESCRIPTION: "Describe the itching you are having."     Widespread itching 2. SEVERITY: "How bad is it?"    - MILD - doesn't interfere with normal activities   - MODERATE-SEVERE: interferes with work, school, sleep, or other activities      moderate 3. SCRATCHING: "Are there any scratch marks? Bleeding?"     Itching all over 4. ONSET: "When did this begin?"      2 days after CT scan 5. CAUSE: "What do you think is causing the itching?" (ask about swimming pools, pollen, animals, soaps, etc.)     Dye from CT scan 6. OTHER SYMPTOMS: "Do you have any other symptoms?"      Bumps on skin 7. PREGNANCY: "Is there any chance you are pregnant?" "When was your last menstrual period?"     *No Answer*  Protocols used: Itching - [facility]

## 2021-08-06 NOTE — Telephone Encounter (Signed)
NP appt scheduled

## 2021-08-12 NOTE — Discharge Instructions (Signed)

## 2021-08-14 ENCOUNTER — Ambulatory Visit
Admission: RE | Admit: 2021-08-14 | Discharge: 2021-08-14 | Disposition: A | Discharge status: Home / Self Care | Payer: Medicare HMO | Attending: Ophthalmology | Admitting: Ophthalmology

## 2021-08-14 ENCOUNTER — Ambulatory Visit: Payer: Medicare HMO | Admitting: Anesthesiology

## 2021-08-14 ENCOUNTER — Encounter: Payer: Self-pay | Admitting: Ophthalmology

## 2021-08-14 ENCOUNTER — Other Ambulatory Visit: Payer: Self-pay

## 2021-08-14 ENCOUNTER — Encounter
Admission: RE | Disposition: A | Discharge status: Home / Self Care | Payer: Self-pay | Source: Home / Self Care | Attending: Ophthalmology

## 2021-08-14 DIAGNOSIS — E669 Obesity, unspecified: Secondary | ICD-10-CM | POA: Insufficient documentation

## 2021-08-14 DIAGNOSIS — H25811 Combined forms of age-related cataract, right eye: Secondary | ICD-10-CM | POA: Diagnosis not present

## 2021-08-14 DIAGNOSIS — Z6832 Body mass index (BMI) 32.0-32.9, adult: Secondary | ICD-10-CM | POA: Insufficient documentation

## 2021-08-14 DIAGNOSIS — H2511 Age-related nuclear cataract, right eye: Secondary | ICD-10-CM | POA: Insufficient documentation

## 2021-08-14 HISTORY — DX: Gastro-esophageal reflux disease without esophagitis: K21.9

## 2021-08-14 HISTORY — DX: Presence of dental prosthetic device (complete) (partial): Z97.2

## 2021-08-14 HISTORY — PX: CATARACT EXTRACTION W/PHACO: SHX586

## 2021-08-14 SURGERY — PHACOEMULSIFICATION, CATARACT, WITH IOL INSERTION
Anesthesia: Monitor Anesthesia Care | Site: Eye | Laterality: Right

## 2021-08-14 MED ORDER — ACETAMINOPHEN 325 MG PO TABS: 325.0000 mg | ORAL_TABLET | ORAL | Status: DC | PRN

## 2021-08-14 MED ORDER — SIGHTPATH DOSE#1 BSS IO SOLN
INTRAOCULAR | Status: DC | PRN
  Administered 2021-08-14: 15 mL via INTRAOCULAR

## 2021-08-14 MED ORDER — MIDAZOLAM HCL 2 MG/2ML IJ SOLN
INTRAMUSCULAR | Status: DC | PRN
  Administered 2021-08-14 (×2): 1 mg via INTRAVENOUS

## 2021-08-14 MED ORDER — SIGHTPATH DOSE#1 BSS IO SOLN
INTRAOCULAR | Status: DC | PRN
  Administered 2021-08-14: 2 mL

## 2021-08-14 MED ORDER — SIGHTPATH DOSE#1 NA HYALUR & NA CHOND-NA HYALUR IO KIT
PACK | INTRAOCULAR | Status: DC | PRN
  Administered 2021-08-14: 1 via OPHTHALMIC

## 2021-08-14 MED ORDER — ACETAMINOPHEN 160 MG/5ML PO SOLN: 325.0000 mg | ORAL | Status: DC | PRN

## 2021-08-14 MED ORDER — LACTATED RINGERS IV SOLN: INTRAVENOUS | Status: DC

## 2021-08-14 MED ORDER — BRIMONIDINE TARTRATE-TIMOLOL 0.2-0.5 % OP SOLN
OPHTHALMIC | Status: DC | PRN
  Administered 2021-08-14: 1 [drp] via OPHTHALMIC

## 2021-08-14 MED ORDER — ONDANSETRON HCL 4 MG/2ML IJ SOLN: 4.0000 mg | Freq: Once | INTRAMUSCULAR | Status: DC | PRN

## 2021-08-14 MED ORDER — TETRACAINE HCL 0.5 % OP SOLN
1.0000 [drp] | OPHTHALMIC | Status: DC | PRN
  Administered 2021-08-14 (×3): 1 [drp] via OPHTHALMIC

## 2021-08-14 MED ORDER — SIGHTPATH DOSE#1 BSS IO SOLN
INTRAOCULAR | Status: DC | PRN
  Administered 2021-08-14: 113 mL via OPHTHALMIC

## 2021-08-14 MED ORDER — FENTANYL CITRATE (PF) 100 MCG/2ML IJ SOLN
INTRAMUSCULAR | Status: DC | PRN
  Administered 2021-08-14: 50 ug via INTRAVENOUS
  Administered 2021-08-14 (×2): 25 ug via INTRAVENOUS

## 2021-08-14 MED ORDER — CEFUROXIME OPHTHALMIC INJECTION 1 MG/0.1 ML
INJECTION | OPHTHALMIC | Status: DC | PRN
  Administered 2021-08-14: 0.1 mL via INTRACAMERAL

## 2021-08-14 MED ORDER — ARMC OPHTHALMIC DILATING DROPS
1.0000 "application " | OPHTHALMIC | Status: DC | PRN
  Administered 2021-08-14 (×3): 1 via OPHTHALMIC

## 2021-08-14 SURGICAL SUPPLY — 13 items
CANNULA ANT/CHMB 27G (MISCELLANEOUS) IMPLANT
CANNULA ANT/CHMB 27GA (MISCELLANEOUS) IMPLANT
CATARACT SUITE SIGHTPATH (MISCELLANEOUS) ×2 IMPLANT
FEE CATARACT SUITE SIGHTPATH (MISCELLANEOUS) ×1 IMPLANT
GLOVE SRG 8 PF TXTR STRL LF DI (GLOVE) ×1 IMPLANT
GLOVE SURG ENC TEXT LTX SZ7.5 (GLOVE) ×2 IMPLANT
GLOVE SURG UNDER POLY LF SZ8 (GLOVE) ×2
LENS IOL TECNIS EYHANCE 19.0 (Intraocular Lens) ×1 IMPLANT
NDL FILTER BLUNT 18X1 1/2 (NEEDLE) ×1 IMPLANT
NEEDLE FILTER BLUNT 18X 1/2SAF (NEEDLE) ×1
NEEDLE FILTER BLUNT 18X1 1/2 (NEEDLE) ×1 IMPLANT
SYR 3ML LL SCALE MARK (SYRINGE) ×2 IMPLANT
WATER STERILE IRR 250ML POUR (IV SOLUTION) ×2 IMPLANT

## 2021-08-14 NOTE — H&P (Signed)
[facility]   Primary Care Physician:  Pcp, No Ophthalmologist: Dr. Lockie Mola  Pre-Procedure History & Physical: HPI:  Caitlyn Miranda is a 76 y.o. female here for ophthalmic surgery.   Past Medical History:  Diagnosis Date   Gallstones    GERD (gastroesophageal reflux disease)    Wears dentures    partial upper and lower    Past Surgical History:  Procedure Laterality Date   MASTECTOMY Right 1989    Prior to Admission medications   Medication Sig Start Date End Date Taking? Authorizing Provider  famotidine (PEPCID) 10 MG tablet Take 10 mg by mouth 2 (two) times daily as needed for heartburn or indigestion.   Yes [provider]  loratadine (CLARITIN) 10 MG tablet Take 10 mg by mouth daily.   Yes [provider]  Probiotic Product (PROBIOTIC PO) Take by mouth daily.   Yes [provider]  Multiple Vitamins-Minerals (MULTIVITAMIN GUMMIES ADULT PO) Take by mouth. 2 gummies daily Patient not taking: Reported on 08/14/2021    [provider]    Allergies as of 07/16/2021 - Review Complete 01/12/2018  Allergen Reaction Noted   Biaxin [clarithromycin] Other (See Comments) 08/24/2017   Codeine  08/24/2017    Family History  Problem Relation Age of Onset   Hypertension Mother    Hypertension Father    Heart disease Father    Diabetes Father    Lung disease Father    Cancer Sister        breast   Pancreatitis Maternal Grandmother    Hypertension Sister     Social History   Socioeconomic History   Marital status: Widowed    Spouse name: Not on file   Number of children: Not on file   Years of education: Not on file   Highest education level: Not on file  Occupational History   Not on file  Tobacco Use   Smoking status: Never   Smokeless tobacco: Never  Vaping Use   Vaping Use: Never used  Substance and Sexual Activity   Alcohol use: Yes    Comment: rarely   Drug use: No   Sexual activity: Not on file  Other  Topics Concern   Not on file  Social History Narrative   Not on file   Social Determinants of Health   Financial Resource Strain: Not on file  Food Insecurity: Not on file  Transportation Needs: Not on file  Physical Activity: Not on file  Stress: Not on file  Social Connections: Not on file  Intimate Partner Violence: Not on file    Review of Systems: See HPI, otherwise negative ROS  Physical Exam: BP (!) 180/72    Pulse 80    Temp (!) 97.3 F (36.3 C) (Temporal)    Resp 18    Ht 5\' 3"  (1.6 m)    Wt 85.7 kg    SpO2 97%    BMI 33.48 kg/m  General:   Alert,  pleasant and cooperative in NAD Head:  Normocephalic and atraumatic. Lungs:  Clear to auscultation.    Heart:  Regular rate and rhythm.   Impression/Plan: Caitlyn Miranda is here for ophthalmic surgery.  Risks, benefits, limitations, and alternatives regarding ophthalmic surgery have been reviewed with the patient.  Questions have been answered.  All parties agreeable.   Linda Hedges, MD  08/14/2021, 9:53 AM

## 2021-08-14 NOTE — Anesthesia Preprocedure Evaluation (Signed)
Anesthesia Evaluation  Patient identified by MRN, date of birth, ID band Patient awake    Reviewed: Allergy & Precautions, NPO status , Patient's Chart, lab work & pertinent test results  Airway Mallampati: II  TM Distance: >3 FB Neck ROM: Full    Dental no notable dental hx.    Pulmonary neg pulmonary ROS,    Pulmonary exam normal breath sounds clear to auscultation       Cardiovascular Exercise Tolerance: Good hypertension, Normal cardiovascular exam Rhythm:Regular Rate:Normal     Neuro/Psych negative neurological ROS  negative psych ROS   GI/Hepatic Neg liver ROS, GERD  ,  Endo/Other  obesity  Renal/GU negative Renal ROS  negative genitourinary   Musculoskeletal negative musculoskeletal ROS (+)   Abdominal (+) + obese,  Abdomen: soft.    Peds negative pediatric ROS (+)  Hematology negative hematology ROS (+)   Anesthesia Other Findings   Reproductive/Obstetrics negative OB ROS                             Anesthesia Physical Anesthesia Plan  ASA: 2  Anesthesia Plan: MAC   Post-op Pain Management: Minimal or no pain anticipated   Induction: Intravenous  PONV Risk Score and Plan: 2 and Midazolam and Treatment may vary due to age or medical condition  Airway Management Planned: Nasal Cannula and Natural Airway  Additional Equipment:   Intra-op Plan:   Post-operative Plan:   Informed Consent: I have reviewed the patients History and Physical, chart, labs and discussed the procedure including the risks, benefits and alternatives for the proposed anesthesia with the patient or authorized representative who has indicated his/her understanding and acceptance.     Dental advisory given  Plan Discussed with: CRNA and Anesthesiologist  Anesthesia Plan Comments:         Anesthesia Quick Evaluation

## 2021-08-14 NOTE — Transfer of Care (Signed)
Immediate Anesthesia Transfer of Care Note  Patient: Caitlyn Miranda  Procedure(s) Performed: CATARACT EXTRACTION PHACO AND INTRAOCULAR LENS PLACEMENT (IOC) RIGHT 28.18 02:41.5 (Right: Eye)  Patient Location: PACU  Anesthesia Type: MAC  Level of Consciousness: awake, alert  and patient cooperative  Airway and Oxygen Therapy: Patient Spontanous Breathing and Patient connected to supplemental oxygen  Post-op Assessment: Post-op Vital signs reviewed, Patient's Cardiovascular Status Stable, Respiratory Function Stable, Patent Airway and No signs of Nausea or vomiting  Post-op Vital Signs: Reviewed and stable  Complications: No notable events documented.

## 2021-08-14 NOTE — Anesthesia Procedure Notes (Signed)
Procedure Name: MAC Date/Time: 08/14/2021 10:00 AM Performed by: Dionne Bucy, CRNA Pre-anesthesia Checklist: Patient identified, Emergency Drugs available, Suction available, Patient being monitored and Timeout performed Patient Re-evaluated:Patient Re-evaluated prior to induction Oxygen Delivery Method: Nasal cannula Placement Confirmation: positive ETCO2

## 2021-08-14 NOTE — Op Note (Signed)
LOCATION:  [facility]   PREOPERATIVE DIAGNOSIS:    Nuclear sclerotic cataract right eye. H25.11   POSTOPERATIVE DIAGNOSIS:  Nuclear sclerotic cataract right eye.     PROCEDURE:  Phacoemusification with posterior chamber intraocular lens placement of the right eye   ULTRASOUND TIME: Procedure(s): CATARACT EXTRACTION PHACO AND INTRAOCULAR LENS PLACEMENT (IOC) RIGHT 28.18 02:41.5 (Right)  LENS:   Implant Name Type Inv. Item Serial No. Manufacturer Lot No. LRB No. Used Action  LENS IOL TECNIS EYHANCE 19.0 - [id number] Intraocular Lens LENS IOL TECNIS EYHANCE 19.0 [id number] [employer]  Right 1 Implanted         SURGEON:  Deirdre Evener, MD   ANESTHESIA:  Topical with tetracaine drops and 2% Xylocaine jelly, augmented with 1% preservative-free intracameral lidocaine.    COMPLICATIONS:  None.   DESCRIPTION OF PROCEDURE:  The patient was identified in the holding room and transported to the operating room and placed in the supine position under the operating microscope.  The right eye was identified as the operative eye and it was prepped and draped in the usual sterile ophthalmic fashion.   A 1 millimeter clear-corneal paracentesis was made at the 12:00 position.  0.5 ml of preservative-free 1% lidocaine was injected into the anterior chamber. The anterior chamber was filled with Viscoat viscoelastic.  A 2.4 millimeter keratome was used to make a near-clear corneal incision at the 9:00 position.  A curvilinear capsulorrhexis was made with a cystotome and capsulorrhexis forceps.  Balanced salt solution was used to hydrodissect and hydrodelineate the nucleus.   Phacoemulsification was then used in stop and chop fashion to remove the lens nucleus and epinucleus.  The remaining cortex was then removed using the irrigation and aspiration handpiece. Provisc was then placed into the capsular bag to distend it for lens placement.  A lens was then injected into the capsular bag.  The  remaining viscoelastic was aspirated.   Wounds were hydrated with balanced salt solution.  The anterior chamber was inflated to a physiologic pressure with balanced salt solution.  No wound leaks were noted. Cefuroxime 0.1 ml of a 10mg /ml solution was injected into the anterior chamber for a dose of 1 mg of intracameral antibiotic at the completion of the case.   Timolol and Brimonidine drops were applied to the eye.  The patient was taken to the recovery room in stable condition without complications of anesthesia or surgery.   Caitlyn Miranda 08/14/2021, 10:23 AM

## 2021-08-14 NOTE — Anesthesia Postprocedure Evaluation (Signed)
Anesthesia Post Note  Patient: Caitlyn Miranda  Procedure(s) Performed: CATARACT EXTRACTION PHACO AND INTRAOCULAR LENS PLACEMENT (IOC) RIGHT 28.18 02:41.5 (Right: Eye)     Patient location during evaluation: PACU Anesthesia Type: MAC Level of consciousness: awake and alert Pain management: pain level controlled Vital Signs Assessment: post-procedure vital signs reviewed and stable Respiratory status: spontaneous breathing, nonlabored ventilation, respiratory function stable and patient connected to nasal cannula oxygen Cardiovascular status: blood pressure returned to baseline and stable Postop Assessment: no apparent nausea or vomiting Anesthetic complications: no   No notable events documented.  Sinda Du

## 2021-08-15 ENCOUNTER — Encounter: Payer: Self-pay | Admitting: Ophthalmology

## 2021-08-20 DIAGNOSIS — H2512 Age-related nuclear cataract, left eye: Secondary | ICD-10-CM | POA: Diagnosis not present

## 2021-08-26 NOTE — Discharge Instructions (Signed)

## 2021-08-28 ENCOUNTER — Other Ambulatory Visit: Payer: Self-pay

## 2021-08-28 ENCOUNTER — Ambulatory Visit: Payer: Medicare HMO | Admitting: Anesthesiology

## 2021-08-28 ENCOUNTER — Encounter: Payer: Self-pay | Admitting: Ophthalmology

## 2021-08-28 ENCOUNTER — Ambulatory Visit
Admission: RE | Admit: 2021-08-28 | Discharge: 2021-08-28 | Disposition: A | Discharge status: Home / Self Care | Payer: Medicare HMO | Attending: Ophthalmology | Admitting: Ophthalmology

## 2021-08-28 ENCOUNTER — Encounter
Admission: RE | Disposition: A | Discharge status: Home / Self Care | Payer: Self-pay | Source: Home / Self Care | Attending: Ophthalmology

## 2021-08-28 DIAGNOSIS — Z6834 Body mass index (BMI) 34.0-34.9, adult: Secondary | ICD-10-CM | POA: Insufficient documentation

## 2021-08-28 DIAGNOSIS — H25812 Combined forms of age-related cataract, left eye: Secondary | ICD-10-CM | POA: Diagnosis not present

## 2021-08-28 DIAGNOSIS — H2512 Age-related nuclear cataract, left eye: Secondary | ICD-10-CM | POA: Insufficient documentation

## 2021-08-28 DIAGNOSIS — I10 Essential (primary) hypertension: Secondary | ICD-10-CM | POA: Diagnosis not present

## 2021-08-28 DIAGNOSIS — K219 Gastro-esophageal reflux disease without esophagitis: Secondary | ICD-10-CM | POA: Diagnosis not present

## 2021-08-28 DIAGNOSIS — E669 Obesity, unspecified: Secondary | ICD-10-CM | POA: Diagnosis not present

## 2021-08-28 HISTORY — PX: CATARACT EXTRACTION W/PHACO: SHX586

## 2021-08-28 SURGERY — PHACOEMULSIFICATION, CATARACT, WITH IOL INSERTION
Anesthesia: Monitor Anesthesia Care | Site: Eye | Laterality: Left

## 2021-08-28 MED ORDER — SIGHTPATH DOSE#1 BSS IO SOLN
INTRAOCULAR | Status: DC | PRN
  Administered 2021-08-28: 82 mL via OPHTHALMIC

## 2021-08-28 MED ORDER — SIGHTPATH DOSE#1 NA HYALUR & NA CHOND-NA HYALUR IO KIT
PACK | INTRAOCULAR | Status: DC | PRN
  Administered 2021-08-28: 1 via OPHTHALMIC

## 2021-08-28 MED ORDER — ACETAMINOPHEN 10 MG/ML IV SOLN: 1000.0000 mg | Freq: Once | INTRAVENOUS | Status: DC | PRN

## 2021-08-28 MED ORDER — ONDANSETRON HCL 4 MG/2ML IJ SOLN: 4.0000 mg | Freq: Once | INTRAMUSCULAR | Status: DC | PRN

## 2021-08-28 MED ORDER — FENTANYL CITRATE (PF) 100 MCG/2ML IJ SOLN
INTRAMUSCULAR | Status: DC | PRN
  Administered 2021-08-28 (×2): 25 ug via INTRAVENOUS
  Administered 2021-08-28: 50 ug via INTRAVENOUS

## 2021-08-28 MED ORDER — ACETAMINOPHEN 325 MG PO TABS
650.0000 mg | ORAL_TABLET | Freq: Four times a day (QID) | ORAL | Status: DC | PRN
  Administered 2021-08-28: 650 mg via ORAL

## 2021-08-28 MED ORDER — SIGHTPATH DOSE#1 BSS IO SOLN
INTRAOCULAR | Status: DC | PRN
  Administered 2021-08-28: 15 mL via INTRAOCULAR

## 2021-08-28 MED ORDER — TETRACAINE HCL 0.5 % OP SOLN
1.0000 [drp] | OPHTHALMIC | Status: DC | PRN
  Administered 2021-08-28 (×3): 1 [drp] via OPHTHALMIC

## 2021-08-28 MED ORDER — CEFUROXIME OPHTHALMIC INJECTION 1 MG/0.1 ML
INJECTION | OPHTHALMIC | Status: DC | PRN
  Administered 2021-08-28: 0.1 mL via INTRACAMERAL

## 2021-08-28 MED ORDER — BRIMONIDINE TARTRATE-TIMOLOL 0.2-0.5 % OP SOLN
OPHTHALMIC | Status: DC | PRN
  Administered 2021-08-28: 1 [drp] via OPHTHALMIC

## 2021-08-28 MED ORDER — LACTATED RINGERS IV SOLN: INTRAVENOUS | Status: DC

## 2021-08-28 MED ORDER — SIGHTPATH DOSE#1 BSS IO SOLN
INTRAOCULAR | Status: DC | PRN
  Administered 2021-08-28: 2 mL

## 2021-08-28 MED ORDER — ARMC OPHTHALMIC DILATING DROPS
1.0000 "application " | OPHTHALMIC | Status: DC | PRN
  Administered 2021-08-28 (×3): 1 via OPHTHALMIC

## 2021-08-28 MED ORDER — MIDAZOLAM HCL 2 MG/2ML IJ SOLN
INTRAMUSCULAR | Status: DC | PRN
  Administered 2021-08-28 (×2): 1 mg via INTRAVENOUS

## 2021-08-28 SURGICAL SUPPLY — 13 items
CANNULA ANT/CHMB 27G (MISCELLANEOUS) IMPLANT
CANNULA ANT/CHMB 27GA (MISCELLANEOUS) IMPLANT
CATARACT SUITE SIGHTPATH (MISCELLANEOUS) ×2 IMPLANT
FEE CATARACT SUITE SIGHTPATH (MISCELLANEOUS) ×1 IMPLANT
GLOVE SRG 8 PF TXTR STRL LF DI (GLOVE) ×1 IMPLANT
GLOVE SURG ENC TEXT LTX SZ7.5 (GLOVE) ×2 IMPLANT
GLOVE SURG UNDER POLY LF SZ8 (GLOVE) ×2
LENS IOL TECNIS EYHANCE 19.0 (Intraocular Lens) ×1 IMPLANT
NDL FILTER BLUNT 18X1 1/2 (NEEDLE) ×1 IMPLANT
NEEDLE FILTER BLUNT 18X 1/2SAF (NEEDLE) ×1
NEEDLE FILTER BLUNT 18X1 1/2 (NEEDLE) ×1 IMPLANT
SYR 3ML LL SCALE MARK (SYRINGE) ×2 IMPLANT
WATER STERILE IRR 250ML POUR (IV SOLUTION) ×2 IMPLANT

## 2021-08-28 NOTE — Anesthesia Postprocedure Evaluation (Signed)
Anesthesia Post Note ? ?Patient: Caitlyn Miranda ? ?Procedure(s) Performed: CATARACT EXTRACTION PHACO AND INTRAOCULAR LENS PLACEMENT (IOC) LEFT 17.60 01:34.3 (Left: Eye) ? ? ?  ?Patient location during evaluation: PACU ?Anesthesia Type: MAC ?Level of consciousness: awake and alert ?Pain management: pain level controlled ?Vital Signs Assessment: post-procedure vital signs reviewed and stable ?Respiratory status: spontaneous breathing, nonlabored ventilation, respiratory function stable and patient connected to nasal cannula oxygen ?Cardiovascular status: stable and blood pressure returned to baseline ?Postop Assessment: no apparent nausea or vomiting ?Anesthetic complications: no ? ? ?No notable events documented. ? ?Lorra Freeman A  Xzaria Teo ? ? ? ? ? ?

## 2021-08-28 NOTE — Transfer of Care (Signed)
Immediate Anesthesia Transfer of Care Note ? ?Patient: Caitlyn Miranda ? ?Procedure(s) Performed: CATARACT EXTRACTION PHACO AND INTRAOCULAR LENS PLACEMENT (IOC) LEFT 17.60 01:34.3 (Left: Eye) ? ?Patient Location: PACU ? ?Anesthesia Type: MAC ? ?Level of Consciousness: awake, alert  and patient cooperative ? ?Airway and Oxygen Therapy: Patient Spontanous Breathing and Patient connected to supplemental oxygen ? ?Post-op Assessment: Post-op Vital signs reviewed, Patient's Cardiovascular Status Stable, Respiratory Function Stable, Patent Airway and No signs of Nausea or vomiting ? ?Post-op Vital Signs: Reviewed and stable ? ?Complications: No notable events documented. ? ?

## 2021-08-28 NOTE — H&P (Signed)
?  [facility]  ? ?Primary Care Physician:  Pcp, No ?Ophthalmologist: Dr. Lockie Mola ? ?Pre-Procedure History & Physical: ?HPI:  Caitlyn Miranda is a 76 y.o. female here for ophthalmic surgery. ?  ?Past Medical History:  ?Diagnosis Date  ? Gallstones   ? GERD (gastroesophageal reflux disease)   ? Wears dentures   ? partial upper and lower  ? ? ?Past Surgical History:  ?Procedure Laterality Date  ? CATARACT EXTRACTION W/PHACO Right 08/14/2021  ? Procedure: CATARACT EXTRACTION PHACO AND INTRAOCULAR LENS PLACEMENT (IOC) RIGHT 28.18 02:41.5;  Surgeon: Lockie Mola, MD;  Location: [facility];  Service: Ophthalmology;  Laterality: Right;  ? MASTECTOMY Right 1989  ? ? ?Prior to Admission medications   ?Medication Sig Start Date End Date Taking? Authorizing Provider  ?famotidine (PEPCID) 10 MG tablet Take 10 mg by mouth 2 (two) times daily as needed for heartburn or indigestion.   Yes [provider]  ?loratadine (CLARITIN) 10 MG tablet Take 10 mg by mouth daily.   Yes [provider]  ?Probiotic Product (PROBIOTIC PO) Take by mouth daily.   Yes [provider]  ?Multiple Vitamins-Minerals (MULTIVITAMIN GUMMIES ADULT PO) Take by mouth. 2 gummies daily ?Patient not taking: Reported on 08/14/2021    [provider]  ? ? ?Allergies as of 07/16/2021 - Review Complete 01/12/2018  ?Allergen Reaction Noted  ? Biaxin [clarithromycin] Other (See Comments) 08/24/2017  ? Codeine  08/24/2017  ? ? ?Family History  ?Problem Relation Age of Onset  ? Hypertension Mother   ? Hypertension Father   ? Heart disease Father   ? Diabetes Father   ? Lung disease Father   ? Cancer Sister   ?     breast  ? Pancreatitis Maternal Grandmother   ? Hypertension Sister   ? ? ?Social History  ? ?Socioeconomic History  ? Marital status: Widowed  ?  Spouse name: Not on file  ? Number of children: Not on file  ? Years of education: Not on file  ? Highest education level: Not on file   ?Occupational History  ? Not on file  ?Tobacco Use  ? Smoking status: Never  ? Smokeless tobacco: Never  ?Vaping Use  ? Vaping Use: Never used  ?Substance and Sexual Activity  ? Alcohol use: Yes  ?  Comment: rarely  ? Drug use: No  ? Sexual activity: Not on file  ?Other Topics Concern  ? Not on file  ?Social History Narrative  ? Not on file  ? ?Social Determinants of Health  ? ?Financial Resource Strain: Not on file  ?Food Insecurity: Not on file  ?Transportation Needs: Not on file  ?Physical Activity: Not on file  ?Stress: Not on file  ?Social Connections: Not on file  ?Intimate Partner Violence: Not on file  ? ? ?Review of Systems: ?See HPI, otherwise negative ROS ? ?Physical Exam: ?BP (!) 163/65   Pulse 76   Temp 97.7 ?F (36.5 ?C)   Wt 86.2 kg   SpO2 100%   BMI 33.66 kg/m?  ?General:   Alert,  pleasant and cooperative in NAD ?Head:  Normocephalic and atraumatic. ?Lungs:  Clear to auscultation.    ?Heart:  Regular rate and rhythm.  ? ?Impression/Plan: ?Caitlyn Miranda is here for ophthalmic surgery. ? ?Risks, benefits, limitations, and alternatives regarding ophthalmic surgery have been reviewed with the patient.  Questions have been answered.  All parties agreeable. ? ? Lockie Mola, MD  08/28/2021, 9:26 AM ? ?

## 2021-08-28 NOTE — Anesthesia Preprocedure Evaluation (Signed)
Anesthesia Evaluation  ?Patient identified by MRN, date of birth, ID band ?Patient awake ? ? ? ?Reviewed: ?Allergy & Precautions, NPO status , Patient's Chart, lab work & pertinent test results, reviewed documented beta blocker date and time  ? ?History of Anesthesia Complications ?Negative for: history of anesthetic complications ? ?Airway ?Mallampati: II ? ?TM Distance: >3 FB ?Neck ROM: Limited ? ? ? Dental ? ?(+) Partial Upper, Partial Lower ?  ?Pulmonary ? ?  ?Pulmonary exam normal ?breath sounds clear to auscultation ? ? ? ? ? ? Cardiovascular ?Exercise Tolerance: Good ?hypertension, (-) angina(-) DOE Normal cardiovascular exam ?Rhythm:Regular Rate:Normal ? ? ?  ?Neuro/Psych ?  ? GI/Hepatic ?GERD  ,  ?Endo/Other  ?obesity ? Renal/GU ?  ? ?  ?Musculoskeletal ? ? Abdominal ?(+) + obese (BMI 34),   ?Peds ? Hematology ?  ?Anesthesia Other Findings ? ? Reproductive/Obstetrics ? ?  ? ? ? ? ? ? ? ? ? ? ? ? ? ?  ?  ? ? ? ? ? ? ? ? ?Anesthesia Physical ? ?Anesthesia Plan ? ?ASA: 2 ? ?Anesthesia Plan: MAC  ? ?Post-op Pain Management: Minimal or no pain anticipated  ? ?Induction: Intravenous ? ?PONV Risk Score and Plan: 2 and Midazolam and Treatment may vary due to age or medical condition ? ?Airway Management Planned: Nasal Cannula and Natural Airway ? ?Additional Equipment:  ? ?Intra-op Plan:  ? ?Post-operative Plan:  ? ?Informed Consent: I have reviewed the patients History and Physical, chart, labs and discussed the procedure including the risks, benefits and alternatives for the proposed anesthesia with the patient or authorized representative who has indicated his/her understanding and acceptance.  ? ? ? ?Dental advisory given ? ?Plan Discussed with: CRNA and Anesthesiologist ? ?Anesthesia Plan Comments:   ? ? ? ? ? ? ?Anesthesia Quick Evaluation ? ?

## 2021-08-28 NOTE — Op Note (Signed)
OPERATIVE NOTE ? ?Corri Delapaz ?[id number] ?08/28/2021 ? ? ?PREOPERATIVE DIAGNOSIS:  Nuclear sclerotic cataract left eye. H25.12 ?  ?POSTOPERATIVE DIAGNOSIS:    Nuclear sclerotic cataract left eye.   ?  ?PROCEDURE:  Phacoemusification with posterior chamber intraocular lens placement of the left eye  ?Ultrasound time: Procedure(s): ?CATARACT EXTRACTION PHACO AND INTRAOCULAR LENS PLACEMENT (IOC) LEFT 17.60 01:34.3 (Left) ? ?LENS:   ?Implant Name Type Inv. Item Serial No. Manufacturer Lot No. LRB No. Used Action  ?LENS IOL TECNIS EYHANCE 19.0 - [id number] Intraocular Lens LENS IOL TECNIS EYHANCE 19.0 [id number] [employer]  Left 1 Implanted  ?   ? ?SURGEON:  Deirdre Evener, MD ?  ?ANESTHESIA:  Topical with tetracaine drops and 2% Xylocaine jelly, augmented with 1% preservative-free intracameral lidocaine. ? ?  ?COMPLICATIONS:  None. ?  ?DESCRIPTION OF PROCEDURE:  The patient was identified in the holding room and transported to the operating room and placed in the supine position under the operating microscope.  The left eye was identified as the operative eye and it was prepped and draped in the usual sterile ophthalmic fashion. ?  ?A 1 millimeter clear-corneal paracentesis was made at the 1:30 position.  0.5 ml of preservative-free 1% lidocaine was injected into the anterior chamber. ? The anterior chamber was filled with Viscoat viscoelastic.  A 2.4 millimeter keratome was used to make a near-clear corneal incision at the 10:30 position.  .  A curvilinear capsulorrhexis was made with a cystotome and capsulorrhexis forceps.  Balanced salt solution was used to hydrodissect and hydrodelineate the nucleus. ?  ?Phacoemulsification was then used in stop and chop fashion to remove the lens nucleus and epinucleus.  The remaining cortex was then removed using the irrigation and aspiration handpiece. Provisc was then placed into the capsular bag to distend it for lens placement.  A lens was then injected into the  capsular bag.  The remaining viscoelastic was aspirated. ?  ?Wounds were hydrated with balanced salt solution.  The anterior chamber was inflated to a physiologic pressure with balanced salt solution.  No wound leaks were noted. Cefuroxime 0.1 ml of a 10mg /ml solution was injected into the anterior chamber for a dose of 1 mg of intracameral antibiotic at the completion of the case. ?  Timolol and Brimonidine drops were applied to the eye.  The patient was taken to the recovery room in stable condition without complications of anesthesia or surgery. ? ?Furkan Keenum ?08/28/2021, 10:20 AM ? ?

## 2021-08-29 ENCOUNTER — Encounter: Payer: Self-pay | Admitting: Ophthalmology

## 2021-10-23 DIAGNOSIS — Z961 Presence of intraocular lens: Secondary | ICD-10-CM | POA: Diagnosis not present

## 2021-11-06 ENCOUNTER — Telehealth: Payer: Self-pay

## 2021-11-06 NOTE — Telephone Encounter (Signed)
Error

## 2021-12-26 NOTE — Progress Notes (Signed)
BP (!) 164/89   Pulse 90   Temp 98.3 F (36.8 C) (Oral)   Ht 5\' 2"  (1.575 m)   Wt 186 lb 9.6 oz (84.6 kg)   SpO2 98%   BMI 34.13 kg/m    Subjective:    Patient ID: Caitlyn Miranda, female    DOB: 05/24/1946, 76 y.o.   MRN: [id number]  HPI: Caitlyn Miranda is a 76 y.o. female  Chief Complaint  Patient presents with   Establish Care   Knee Pain    R knee pain, states she might have a torn meniscus    Patient presents to clinic to establish care with new PCP.  Introduced to 61 role and practice setting.  All questions answered.  Discussed provider/patient relationship and expectations.  Patient reports a history of hypertension, GERD, elevated TSH and seasonal allergies.  Patient states her blood pressure runs about 142/80 at home.    Patient denies a history of:  Elevated Cholesterol, Diabetes, Thyroid problems, Depression, Anxiety, Neurological problems, and Abdominal problems.   KNEE PAIN Duration:  2 weeks Involved knee: right Mechanism of injury: unknown Location:anterior Onset: sudden Severity: mild  Quality:  burning and throbbing Frequency: intermittent Radiation: no Aggravating factors: movement  Alleviating factors:  tylenol   Status: stable Treatments attempted:  tylenol  voltaren gel Relief with NSAIDs?:  No NSAIDs Taken Weakness with weight bearing or walking:  sometimes when she first gets up in the morning Sensation of giving way: no Locking: no Popping: no Bruising: no Swelling:yes Redness: no Paresthesias/decreased sensation: no Fevers: no   Denies HA, CP, SOB, dizziness, palpitations, visual changes, and lower extremity swelling.  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   Allergic rhinitis 08/24/2017   Excessive ear wax, bilateral 08/24/2017   Elevated TSH 09/16/2017   Decreased hearing 01/12/2018   Resolved Ambulatory Problems    Diagnosis Date  Noted   No Resolved Ambulatory Problems   Past Medical History:  Diagnosis Date   Gallstones    GERD (gastroesophageal reflux disease)    Wears dentures    Past Surgical History:  Procedure Laterality Date   CATARACT EXTRACTION W/PHACO Right 08/14/2021   Procedure: CATARACT EXTRACTION PHACO AND INTRAOCULAR LENS PLACEMENT (IOC) RIGHT 28.18 02:41.5;  Surgeon: 08/16/2021, MD;  Location: [facility];  Service: Ophthalmology;  Laterality: Right;   CATARACT EXTRACTION W/PHACO Left 08/28/2021   Procedure: CATARACT EXTRACTION PHACO AND INTRAOCULAR LENS PLACEMENT (IOC) LEFT 17.60 01:34.3;  Surgeon: 10/28/2021, MD;  Location: [facility];  Service: Ophthalmology;  Laterality: Left;   MASTECTOMY Right 1989   Family History  Problem Relation Age of Onset   Hypertension Mother    Hypertension Father    Heart disease Father    Diabetes Father    Lung disease Father    Cancer Sister        breast   Pancreatitis Maternal Grandmother    Hypertension Sister      Review of Systems  Eyes:  Negative for visual disturbance.  Respiratory:  Negative for cough, chest tightness and shortness of breath.   Cardiovascular:  Negative for chest pain, palpitations and leg swelling.  Neurological:  Negative for dizziness and headaches.    Per HPI unless specifically indicated above     Objective:    BP (!) 164/89   Pulse 90   Temp 98.3 F (36.8 C) (Oral)   Ht 5\' 2"  (1.575  m)   Wt 186 lb 9.6 oz (84.6 kg)   SpO2 98%   BMI 34.13 kg/m   Wt Readings from Last 3 Encounters:  12/27/21 186 lb 9.6 oz (84.6 kg)  08/28/21 190 lb (86.2 kg)  08/14/21 189 lb (85.7 kg)    Physical Exam Vitals and nursing note reviewed.  Constitutional:      General: She is not in acute distress.    Appearance: Normal appearance. She is normal weight. She is not ill-appearing, toxic-appearing or diaphoretic.  HENT:     Head: Normocephalic.     Right Ear: External ear normal.     Left  Ear: External ear normal.     Nose: Nose normal.     Mouth/Throat:     Mouth: Mucous membranes are moist.     Pharynx: Oropharynx is clear.  Eyes:     General:        Right eye: No discharge.        Left eye: No discharge.     Extraocular Movements: Extraocular movements intact.     Conjunctiva/sclera: Conjunctivae normal.     Pupils: Pupils are equal, round, and reactive to light.  Cardiovascular:     Rate and Rhythm: Normal rate and regular rhythm.     Heart sounds: No murmur heard. Pulmonary:     Effort: Pulmonary effort is normal. No respiratory distress.     Breath sounds: Normal breath sounds. No wheezing or rales.  Musculoskeletal:     Cervical back: Normal range of motion and neck supple.  Skin:    General: Skin is warm and dry.     Capillary Refill: Capillary refill takes less than 2 seconds.  Neurological:     General: No focal deficit present.     Mental Status: She is alert and oriented to person, place, and time. Mental status is at baseline.  Psychiatric:        Mood and Affect: Mood normal.        Behavior: Behavior normal.        Thought Content: Thought content normal.        Judgment: Judgment normal.     Results for orders placed or performed during the hospital encounter of 07/30/21  Resp Panel by RT-PCR (Flu A&B, Covid) Nasopharyngeal Swab   Specimen: Nasopharyngeal Swab; Nasopharyngeal(NP) swabs in vial transport medium  Result Value Ref Range   SARS Coronavirus 2 by RT PCR NEGATIVE NEGATIVE   Influenza A by PCR NEGATIVE NEGATIVE   Influenza B by PCR NEGATIVE NEGATIVE  CBC  Result Value Ref Range   WBC 9.3 4.0 - 10.5 K/uL   RBC 5.16 (H) 3.87 - 5.11 MIL/uL   Hemoglobin 14.3 12.0 - 15.0 g/dL   HCT 40.9 81.1 - 91.4 %   MCV 87.4 80.0 - 100.0 fL   MCH 27.7 26.0 - 34.0 pg   MCHC 31.7 30.0 - 36.0 g/dL   RDW 78.2 95.6 - 21.3 %   Platelets 367 150 - 400 K/uL   nRBC 0.0 0.0 - 0.2 %  Basic metabolic panel  Result Value Ref Range   Sodium 137 135 - 145  mmol/L   Potassium 3.7 3.5 - 5.1 mmol/L   Chloride 103 98 - 111 mmol/L   CO2 23 22 - 32 mmol/L   Glucose, Bld 103 (H) 70 - 99 mg/dL   BUN 13 8 - 23 mg/dL   Creatinine, Ser 0.86 0.44 - 1.00 mg/dL   Calcium 9.3 8.9 - 10.3  mg/dL   GFR, Estimated >13 >24 mL/min   Anion gap 11 5 - 15  Troponin I (High Sensitivity)  Result Value Ref Range   Troponin I (High Sensitivity) <2 <18 ng/L      Assessment & Plan:   Problem List Items Addressed This Visit       Cardiovascular and Mediastinum   Hypertension - Primary    Chronic. Not well controlled.  Has not been on medication.  Runs 140/80s at home.  Will start Losartan 25mg  daily.  Side effects and benefits of medication during visit today.        Relevant Medications   losartan (COZAAR) 25 MG tablet     Other   Elevated TSH    Will check labs at next visit. Patient denies concerns at visit today.       Other Visit Diagnoses     Acute pain of right knee       Will obtain xray of right knee.  Suspect arthritis. Continue with Tylenol PRN for pain control. Will make recommendations based on imaging results.   Relevant Orders   DG Knee Complete 4 Views Right   Encounter to establish care            Follow up plan: Return in about 1 month (around 01/26/2022) for Physical and Fasting labs, BP Check.

## 2021-12-27 ENCOUNTER — Ambulatory Visit (INDEPENDENT_AMBULATORY_CARE_PROVIDER_SITE_OTHER): Payer: Medicare HMO | Admitting: Nurse Practitioner

## 2021-12-27 ENCOUNTER — Encounter: Payer: Self-pay | Admitting: Nurse Practitioner

## 2021-12-27 VITALS — BP 164/89 | HR 90 | Temp 98.3°F | Ht 62.0 in | Wt 186.6 lb

## 2021-12-27 DIAGNOSIS — R7989 Other specified abnormal findings of blood chemistry: Secondary | ICD-10-CM | POA: Diagnosis not present

## 2021-12-27 DIAGNOSIS — Z7689 Persons encountering health services in other specified circumstances: Secondary | ICD-10-CM

## 2021-12-27 DIAGNOSIS — I10 Essential (primary) hypertension: Secondary | ICD-10-CM | POA: Diagnosis not present

## 2021-12-27 DIAGNOSIS — M25561 Pain in right knee: Secondary | ICD-10-CM | POA: Diagnosis not present

## 2021-12-27 MED ORDER — LOSARTAN POTASSIUM 25 MG PO TABS: 25.0000 mg | ORAL_TABLET | Freq: Every day | ORAL | 0 refills | Status: DC

## 2021-12-27 NOTE — Assessment & Plan Note (Signed)
Will check labs at next visit. Patient denies concerns at visit today.

## 2021-12-27 NOTE — Assessment & Plan Note (Signed)
Chronic. Not well controlled.  Has not been on medication.  Runs 140/80s at home.  Will start Losartan 25mg  daily.  Side effects and benefits of medication during visit today.

## 2022-01-01 DIAGNOSIS — M2241 Chondromalacia patellae, right knee: Secondary | ICD-10-CM | POA: Diagnosis not present

## 2022-01-16 ENCOUNTER — Other Ambulatory Visit: Payer: Self-pay | Admitting: Nurse Practitioner

## 2022-01-16 NOTE — Telephone Encounter (Signed)
Requested Prescriptions  Pending Prescriptions Disp Refills  . losartan (COZAAR) 25 MG tablet [Pharmacy Med Name: LOSARTAN POTASSIUM 25 MG TAB] 30 tablet 0    Sig: TAKE 1 TABLET (25 MG TOTAL) BY MOUTH DAILY.     Cardiovascular:  Angiotensin Receptor Blockers Failed - 01/16/2022  2:05 AM      Failed - Last BP in normal range    BP Readings from Last 1 Encounters:  12/27/21 (!) 164/89         Passed - Cr in normal range and within 180 days    Creatinine, Ser  Date Value Ref Range Status  07/30/2021 0.95 0.44 - 1.00 mg/dL Final         Passed - K in normal range and within 180 days    Potassium  Date Value Ref Range Status  07/30/2021 3.7 3.5 - 5.1 mmol/L Final         Passed - Patient is not pregnant      Passed - Valid encounter within last 6 months    Recent Outpatient Visits          2 weeks ago Primary hypertension   [facility] Larae Grooms, NP   4 years ago Excessive ear wax, bilateral   [facility] Gabriel Cirri, NP   4 years ago Elevated TSH   [facility] Gabriel Cirri, NP   4 years ago Essential hypertension   [facility] Gabriel Cirri, NP      Future Appointments            In 1 week Larae Grooms, NP [facility], [facility]

## 2022-01-23 NOTE — Progress Notes (Signed)
BP (!) 157/89   Pulse 71   Temp 98.3 F (36.8 C) (Oral)   Wt 185 lb 1.6 oz (84 kg)   SpO2 98%   BMI 33.86 kg/m    Subjective:    Patient ID: Caitlyn Miranda, female    DOB: 1945/09/28, 76 y.o.   MRN: [id number]  HPI: Caitlyn Miranda is a 76 y.o. female presenting on 01/27/2022 for comprehensive medical examination. Current medical complaints include: knee pain- was referred for physical therapy due to arthritis.   She currently lives with: Menopausal Symptoms: no  HYPERTENSION Hypertension status: uncontrolled  Satisfied with current treatment? no Duration of hypertension: years BP monitoring frequency:  daily BP range: 140/80 BP medication side effects:  no Medication compliance: excellent compliance- decided to cut the pill in half because it is causing her a lot of fatigue.  Previous BP meds:losartan (cozaar) Aspirin: no Recurrent headaches: no Visual changes: no Palpitations: no Dyspnea: no Chest pain: no Lower extremity edema: no Dizzy/lightheaded: no  Patient is not sure if she is going to do physical therapy due to being really busy with altering wedding dresses.     Depression Screen done today and results listed below:     01/27/2022   10:11 AM 08/24/2017   10:04 AM  Depression screen PHQ 2/9  Decreased Interest 0 0  Down, Depressed, Hopeless 0 0  PHQ - 2 Score 0 0  Altered sleeping 1 1  Tired, decreased energy 1 1  Change in appetite 0 0  Feeling bad or failure about yourself  0 0  Trouble concentrating 0 1  Moving slowly or fidgety/restless 0 1  Suicidal thoughts 0 0  PHQ-9 Score 2 4  Difficult doing work/chores Somewhat difficult     The patient does not have a history of falls. I did complete a risk assessment for falls. A plan of care for falls was documented.   Past Medical History:  Past Medical History:  Diagnosis Date   Gallstones    GERD (gastroesophageal reflux disease)    Wears dentures    partial upper and lower    Surgical  History:  Past Surgical History:  Procedure Laterality Date   CATARACT EXTRACTION W/PHACO Right 08/14/2021   Procedure: CATARACT EXTRACTION PHACO AND INTRAOCULAR LENS PLACEMENT (IOC) RIGHT 28.18 02:41.5;  Surgeon: Lockie Mola, MD;  Location: [facility];  Service: Ophthalmology;  Laterality: Right;   CATARACT EXTRACTION W/PHACO Left 08/28/2021   Procedure: CATARACT EXTRACTION PHACO AND INTRAOCULAR LENS PLACEMENT (IOC) LEFT 17.60 01:34.3;  Surgeon: Lockie Mola, MD;  Location: [facility];  Service: Ophthalmology;  Laterality: Left;   MASTECTOMY Right 1989    Medications:  Current Outpatient Medications on File Prior to Visit  Medication Sig   famotidine (PEPCID) 10 MG tablet Take 10 mg by mouth 2 (two) times daily as needed for heartburn or indigestion.   loratadine (CLARITIN) 10 MG tablet Take 10 mg by mouth daily.   Multiple Vitamins-Minerals (MULTIVITAMIN GUMMIES ADULT PO) Take by mouth. 2 gummies daily   No current facility-administered medications on file prior to visit.    Allergies:  Allergies  Allergen Reactions   Biaxin [Clarithromycin] Other (See Comments)    ulcers   Codeine    Iodinated Contrast Media Rash    Social History:  Social History   Socioeconomic History   Marital status: Widowed    Spouse name: Not on file   Number of children: Not on file   Years of education: Not on file  Highest education level: Not on file  Occupational History   Not on file  Tobacco Use   Smoking status: Never   Smokeless tobacco: Never  Vaping Use   Vaping Use: Never used  Substance and Sexual Activity   Alcohol use: Yes    Comment: rarely   Drug use: No   Sexual activity: Not Currently  Other Topics Concern   Not on file  Social History Narrative   Not on file   Social Determinants of Health   Financial Resource Strain: Not on file  Food Insecurity: Not on file  Transportation Needs: Not on file  Physical Activity: Not on file   Stress: Not on file  Social Connections: Not on file  Intimate Partner Violence: Not on file   Social History   Tobacco Use  Smoking Status Never  Smokeless Tobacco Never   Social History   Substance and Sexual Activity  Alcohol Use Yes   Comment: rarely    Family History:  Family History  Problem Relation Age of Onset   Hypertension Mother    Hypertension Father    Heart disease Father    Diabetes Father    Lung disease Father    Cancer Sister        breast   Pancreatitis Maternal Grandmother    Hypertension Sister     Past medical history, surgical history, medications, allergies, family history and social history reviewed with patient today and changes made to appropriate areas of the chart.   Review of Systems  Eyes:  Negative for blurred vision and double vision.  Respiratory:  Negative for shortness of breath.   Cardiovascular:  Negative for chest pain, palpitations and leg swelling.  Neurological:  Negative for dizziness and headaches.   All other ROS negative except what is listed above and in the HPI.      Objective:    BP (!) 157/89   Pulse 71   Temp 98.3 F (36.8 C) (Oral)   Wt 185 lb 1.6 oz (84 kg)   SpO2 98%   BMI 33.86 kg/m   Wt Readings from Last 3 Encounters:  01/27/22 185 lb 1.6 oz (84 kg)  12/27/21 186 lb 9.6 oz (84.6 kg)  08/28/21 190 lb (86.2 kg)    Physical Exam Vitals and nursing note reviewed.  Constitutional:      General: She is awake. She is not in acute distress.    Appearance: Normal appearance. She is well-developed. She is obese. She is not ill-appearing.  HENT:     Head: Normocephalic and atraumatic.     Right Ear: Hearing, tympanic membrane, ear canal and external ear normal. No drainage.     Left Ear: Hearing, tympanic membrane, ear canal and external ear normal. No drainage.     Nose: Nose normal.     Right Sinus: No maxillary sinus tenderness or frontal sinus tenderness.     Left Sinus: No maxillary sinus  tenderness or frontal sinus tenderness.     Mouth/Throat:     Mouth: Mucous membranes are moist.     Pharynx: Oropharynx is clear. Uvula midline. No pharyngeal swelling, oropharyngeal exudate or posterior oropharyngeal erythema.  Eyes:     General: Lids are normal.        Right eye: No discharge.        Left eye: No discharge.     Extraocular Movements: Extraocular movements intact.     Conjunctiva/sclera: Conjunctivae normal.     Pupils: Pupils are equal, round,  and reactive to light.     Visual Fields: Right eye visual fields normal and left eye visual fields normal.  Neck:     Thyroid: No thyromegaly.     Vascular: No carotid bruit.     Trachea: Trachea normal.  Cardiovascular:     Rate and Rhythm: Normal rate and regular rhythm.     Heart sounds: Normal heart sounds. No murmur heard.    No gallop.  Pulmonary:     Effort: Pulmonary effort is normal. No accessory muscle usage or respiratory distress.     Breath sounds: Normal breath sounds.  Chest:  Breasts:    Right: Normal.     Left: Normal.  Abdominal:     General: Bowel sounds are normal.     Palpations: Abdomen is soft. There is no hepatomegaly or splenomegaly.     Tenderness: There is no abdominal tenderness.  Musculoskeletal:        General: Normal range of motion.     Cervical back: Normal range of motion and neck supple.     Right lower leg: No edema.     Left lower leg: No edema.  Lymphadenopathy:     Head:     Right side of head: No submental, submandibular, tonsillar, preauricular or posterior auricular adenopathy.     Left side of head: No submental, submandibular, tonsillar, preauricular or posterior auricular adenopathy.     Cervical: No cervical adenopathy.     Upper Body:     Right upper body: No supraclavicular, axillary or pectoral adenopathy.     Left upper body: No supraclavicular, axillary or pectoral adenopathy.  Skin:    General: Skin is warm and dry.     Capillary Refill: Capillary refill takes  less than 2 seconds.     Findings: No rash.  Neurological:     Mental Status: She is alert and oriented to person, place, and time.     Gait: Gait is intact.     Deep Tendon Reflexes: Reflexes are normal and symmetric.     Reflex Scores:      Brachioradialis reflexes are 2+ on the right side and 2+ on the left side.      Patellar reflexes are 2+ on the right side and 2+ on the left side. Psychiatric:        Attention and Perception: Attention normal.        Mood and Affect: Mood normal.        Speech: Speech normal.        Behavior: Behavior normal. Behavior is cooperative.        Thought Content: Thought content normal.        Judgment: Judgment normal.     Results for orders placed or performed during the hospital encounter of 07/30/21  Resp Panel by RT-PCR (Flu A&B, Covid) Nasopharyngeal Swab   Specimen: Nasopharyngeal Swab; Nasopharyngeal(NP) swabs in vial transport medium  Result Value Ref Range   SARS Coronavirus 2 by RT PCR NEGATIVE NEGATIVE   Influenza A by PCR NEGATIVE NEGATIVE   Influenza B by PCR NEGATIVE NEGATIVE  CBC  Result Value Ref Range   WBC 9.3 4.0 - 10.5 K/uL   RBC 5.16 (H) 3.87 - 5.11 MIL/uL   Hemoglobin 14.3 12.0 - 15.0 g/dL   HCT 23.7 62.8 - 31.5 %   MCV 87.4 80.0 - 100.0 fL   MCH 27.7 26.0 - 34.0 pg   MCHC 31.7 30.0 - 36.0 g/dL   RDW 17.6 16.0 -  15.5 %   Platelets 367 150 - 400 K/uL   nRBC 0.0 0.0 - 0.2 %  Basic metabolic panel  Result Value Ref Range   Sodium 137 135 - 145 mmol/L   Potassium 3.7 3.5 - 5.1 mmol/L   Chloride 103 98 - 111 mmol/L   CO2 23 22 - 32 mmol/L   Glucose, Bld 103 (H) 70 - 99 mg/dL   BUN 13 8 - 23 mg/dL   Creatinine, Ser 0.450.95 0.44 - 1.00 mg/dL   Calcium 9.3 8.9 - 40.910.3 mg/dL   GFR, Estimated >81>60 >19>60 mL/min   Anion gap 11 5 - 15  Troponin I (High Sensitivity)  Result Value Ref Range   Troponin I (High Sensitivity) <2 <18 ng/L      Assessment & Plan:   Problem List Items Addressed This Visit       Cardiovascular  and Mediastinum   Hypertension    Chronic. Not well controlled.  Patient is cutting the Losartan in half.  States she needs time for her system to get used to the medication.  Does not want to alter the medication at this time.  Labs ordered today.  Will make recommendations at next visit.  Follow up in 3 months.       Relevant Medications   losartan (COZAAR) 25 MG tablet   Other Visit Diagnoses     Annual physical exam    -  Primary   Health maintenance reviewed during visit today.  Labs ordered. Declined vaccines.  Declined DEXA scan and Colonoscopy.    Relevant Orders   CBC with Differential/Platelet   Comprehensive metabolic panel   Lipid panel   TSH   Urinalysis, Routine w reflex microscopic   Screening for ischemic heart disease       Relevant Orders   Lipid panel        Follow up plan: Return in about 3 months (around 04/29/2022) for BP Check.   LABORATORY TESTING:  - Pap smear: not applicable  IMMUNIZATIONS:   - Tdap: Tetanus vaccination status reviewed: Medicare. - Influenza: Postponed to flu season - Pneumovax: Refused - Prevnar: Refused - COVID: Not applicable - HPV: Not applicable - Shingrix vaccine: Refused  SCREENING: -Mammogram: Not applicable  - Colonoscopy: Refused  - Bone Density: Refused  -Hearing Test: Not applicable  -Spirometry: Not applicable   PATIENT COUNSELING:   Advised to take 1 mg of folate supplement per day if capable of pregnancy.   Sexuality: Discussed sexually transmitted diseases, partner selection, use of condoms, avoidance of unintended pregnancy  and contraceptive alternatives.   Advised to avoid cigarette smoking.  I discussed with the patient that most people either abstain from alcohol or drink within safe limits (<=14/week and <=4 drinks/occasion for males, <=7/weeks and <= 3 drinks/occasion for females) and that the risk for alcohol disorders and other health effects rises proportionally with the number of drinks per  week and how often a drinker exceeds daily limits.  Discussed cessation/primary prevention of drug use and availability of treatment for abuse.   Diet: Encouraged to adjust caloric intake to maintain  or achieve ideal body weight, to reduce intake of dietary saturated fat and total fat, to limit sodium intake by avoiding high sodium foods and not adding table salt, and to maintain adequate dietary potassium and calcium preferably from fresh fruits, vegetables, and low-fat dairy products.    stressed the importance of regular exercise  Injury prevention: Discussed safety belts, safety helmets, smoke detector, smoking near  bedding or upholstery.   Dental health: Discussed importance of regular tooth brushing, flossing, and dental visits.    NEXT PREVENTATIVE PHYSICAL DUE IN 1 YEAR. Return in about 3 months (around 04/29/2022) for BP Check.

## 2022-01-27 ENCOUNTER — Ambulatory Visit (INDEPENDENT_AMBULATORY_CARE_PROVIDER_SITE_OTHER): Payer: Medicare HMO | Admitting: Nurse Practitioner

## 2022-01-27 ENCOUNTER — Encounter: Payer: Self-pay | Admitting: Nurse Practitioner

## 2022-01-27 VITALS — BP 157/89 | HR 71 | Temp 98.3°F | Wt 185.1 lb

## 2022-01-27 DIAGNOSIS — I10 Essential (primary) hypertension: Secondary | ICD-10-CM

## 2022-01-27 DIAGNOSIS — Z0000 Encounter for general adult medical examination without abnormal findings: Secondary | ICD-10-CM

## 2022-01-27 DIAGNOSIS — R7989 Other specified abnormal findings of blood chemistry: Secondary | ICD-10-CM

## 2022-01-27 DIAGNOSIS — Z136 Encounter for screening for cardiovascular disorders: Secondary | ICD-10-CM | POA: Diagnosis not present

## 2022-01-27 DIAGNOSIS — R8290 Unspecified abnormal findings in urine: Secondary | ICD-10-CM | POA: Diagnosis not present

## 2022-01-27 LAB — URINALYSIS, ROUTINE W REFLEX MICROSCOPIC
Bilirubin, UA: NEGATIVE
Glucose, UA: NEGATIVE
Ketones, UA: NEGATIVE
Nitrite, UA: NEGATIVE
Protein,UA: NEGATIVE
Specific Gravity, UA: 1.015 (ref 1.005–1.030)
Urobilinogen, Ur: 0.2 mg/dL (ref 0.2–1.0)
pH, UA: 5.5 (ref 5.0–7.5)

## 2022-01-27 LAB — MICROSCOPIC EXAMINATION

## 2022-01-27 MED ORDER — LOSARTAN POTASSIUM 25 MG PO TABS
25.0000 mg | ORAL_TABLET | Freq: Every day | ORAL | 1 refills | Status: DC
Start: 2022-01-27 — End: 2022-05-15

## 2022-01-27 NOTE — Assessment & Plan Note (Signed)
Chronic. Not well controlled.  Patient is cutting the Losartan in half.  States she needs time for her system to get used to the medication.  Does not want to alter the medication at this time.  Labs ordered today.  Will make recommendations at next visit.  Follow up in 3 months.

## 2022-01-27 NOTE — Addendum Note (Signed)
Addended by: Larae Grooms on: 01/27/2022 11:56 AM   Modules accepted: Orders

## 2022-01-28 LAB — COMPREHENSIVE METABOLIC PANEL
ALT: 9 IU/L (ref 0–32)
AST: 10 IU/L (ref 0–40)
Albumin/Globulin Ratio: 2.2 (ref 1.2–2.2)
Albumin: 4.7 g/dL (ref 3.8–4.8)
Alkaline Phosphatase: 69 IU/L (ref 44–121)
BUN/Creatinine Ratio: 16 (ref 12–28)
BUN: 14 mg/dL (ref 8–27)
Bilirubin Total: 0.4 mg/dL (ref 0.0–1.2)
CO2: 23 mmol/L (ref 20–29)
Calcium: 9.7 mg/dL (ref 8.7–10.3)
Chloride: 102 mmol/L (ref 96–106)
Creatinine, Ser: 0.85 mg/dL (ref 0.57–1.00)
Globulin, Total: 2.1 g/dL (ref 1.5–4.5)
Glucose: 95 mg/dL (ref 70–99)
Potassium: 4.2 mmol/L (ref 3.5–5.2)
Sodium: 140 mmol/L (ref 134–144)
Total Protein: 6.8 g/dL (ref 6.0–8.5)
eGFR: 71 mL/min/{1.73_m2} (ref >59)

## 2022-01-28 LAB — LIPID PANEL
Chol/HDL Ratio: 2.7 ratio (ref 0.0–4.4)
Cholesterol, Total: 203 mg/dL — ABNORMAL HIGH (ref 100–199)
HDL: 75 mg/dL (ref >39)
LDL Chol Calc (NIH): 105 mg/dL — ABNORMAL HIGH (ref 0–99)
Triglycerides: 133 mg/dL (ref 0–149)
VLDL Cholesterol Cal: 23 mg/dL (ref 5–40)

## 2022-01-28 LAB — CBC WITH DIFFERENTIAL/PLATELET
Basophils Absolute: 0 10*3/uL (ref 0.0–0.2)
Basos: 1 %
EOS (ABSOLUTE): 0.1 10*3/uL (ref 0.0–0.4)
Eos: 2 %
Hematocrit: 42.4 % (ref 34.0–46.6)
Hemoglobin: 13.8 g/dL (ref 11.1–15.9)
Immature Grans (Abs): 0 10*3/uL (ref 0.0–0.1)
Immature Granulocytes: 0 %
Lymphocytes Absolute: 2.2 10*3/uL (ref 0.7–3.1)
Lymphs: 33 %
MCH: 29.1 pg (ref 26.6–33.0)
MCHC: 32.5 g/dL (ref 31.5–35.7)
MCV: 89 fL (ref 79–97)
Monocytes Absolute: 0.7 10*3/uL (ref 0.1–0.9)
Monocytes: 10 %
Neutrophils Absolute: 3.7 10*3/uL (ref 1.4–7.0)
Neutrophils: 54 %
Platelets: 309 10*3/uL (ref 150–450)
RBC: 4.75 x10E6/uL (ref 3.77–5.28)
RDW: 13.6 % (ref 11.7–15.4)
WBC: 6.7 10*3/uL (ref 3.4–10.8)

## 2022-01-28 LAB — TSH: TSH: 5.02 u[IU]/mL — ABNORMAL HIGH (ref 0.450–4.500)

## 2022-01-28 NOTE — Progress Notes (Signed)
Please let patient know that overall her lab work looks good.  Her cholesterol is elevated slightly.  I recommend a low fat diet.  Her Thyroid labs are abnormal. I'd like her to come back and repeat additional labs to see if she needs any medication.  This could also be contributing to her fatigue.  No other concerns at this time.  Please make her a lab appt.

## 2022-01-28 NOTE — Addendum Note (Signed)
Addended by: Larae Grooms on: 01/28/2022 07:52 AM   Modules accepted: Orders

## 2022-01-29 LAB — URINE CULTURE

## 2022-01-29 NOTE — Progress Notes (Signed)
No growth on the culture.  No need for antibiotic treatment.

## 2022-02-03 ENCOUNTER — Other Ambulatory Visit: Payer: Medicare HMO

## 2022-02-03 DIAGNOSIS — R7989 Other specified abnormal findings of blood chemistry: Secondary | ICD-10-CM

## 2022-02-04 LAB — THYROID PEROXIDASE ANTIBODY: Thyroperoxidase Ab SerPl-aCnc: 9 IU/mL (ref 0–34)

## 2022-02-04 LAB — THYROID PANEL WITH TSH
Free Thyroxine Index: 1.6 (ref 1.2–4.9)
T3 Uptake Ratio: 22 % — ABNORMAL LOW (ref 24–39)
T4, Total: 7.3 ug/dL (ref 4.5–12.0)
TSH: 4.6 u[IU]/mL — ABNORMAL HIGH (ref 0.450–4.500)

## 2022-02-04 NOTE — Progress Notes (Signed)
Please let aptient know that her lab work shows that her thyroid is barely our of the normal range.  But her T4 is within the normal range.  Meaning there is no need for medication at this time.  This is something we will monitor at future visits.  No other concerns at this time.

## 2022-02-11 ENCOUNTER — Telehealth: Payer: Self-pay

## 2022-02-11 NOTE — Telephone Encounter (Signed)
Copied from CRM [id number]. Topic: General - Call Back - No Documentation >> Feb 11, 2022  3:48 PM Caitlyn Miranda wrote: Reason for CRM: Pt missed call and wants to discuss her lab results. Please advise if P[facility] may disclose.

## 2022-02-12 ENCOUNTER — Ambulatory Visit: Payer: Self-pay | Admitting: *Deleted

## 2022-02-12 NOTE — Telephone Encounter (Signed)
Attemped to call patient regarding lab results. Patient did not answer. LVMTRC.   OK for PEC/Nurse Triage to give results for patient if he calls back.    

## 2022-02-12 NOTE — Telephone Encounter (Signed)
Pt returned call and was given her lab results 02/04/2022 at 8:04 AM from Larae Grooms, NP.  No questions   Reason for Disposition  [1] Follow-up call to recent contact AND [2] information only call, no triage required  Answer Assessment - Initial Assessment Questions 1. REASON FOR CALL or QUESTION: "What is your reason for calling today?" or "How can I best help you?" or "What question do you have that I can help answer?"     Lab results given  Protocols used: Information Only Call - No Triage-A-AH

## 2022-02-14 NOTE — Telephone Encounter (Signed)
Results reviewed by the patient on Mychart. Called and LVM asking for patient to please call us back if she has any further questions or concerns.

## 2022-02-18 ENCOUNTER — Encounter: Payer: Self-pay | Admitting: *Deleted

## 2022-02-18 ENCOUNTER — Telehealth: Payer: Self-pay | Admitting: *Deleted

## 2022-02-18 NOTE — Patient Outreach (Signed)
  Care Coordination   Initial Visit Note   02/18/2022 Name: Caitlyn Miranda MRN: [id number] DOB: 1945-10-05  Caitlyn Miranda is a 76 y.o. year old female who sees Caitlyn Grooms, NP for primary care. I spoke with  Linda Hedges by phone today  What matters to the patients health and wellness today?  Placed all to patient, reviewed [facility] program, patient reports " to keep doing what I'm doing to stay healthy"  Patient declines to schedule Annual Wellness Visit stating "haven't had one and not getting one" upon discussion with RN care manager    Goals Addressed               This Visit's Progress     "to keep doing what I'm doing to stay healthy" (pt-stated)        Care Coordination Interventions: Reviewed medications with patient and discussed importance of compliance Advised patient, providing education and rationale, to monitor blood pressure daily and record, calling PCP for findings outside established parameters Provided education on prescribed diet low sodium and importance of reading labels Placed call to patient, reviewed [facility] program and patient feels she is doing well managing her health at present, receptive to outreach call today but declines any further calls. Medications reviewed and importance of taking as prescribed Reviewed importance of scheduling Annual Wellness Visit  Irving Shows [facility], BSN RN Case Manager [phone]           SDOH assessments and interventions completed:  Yes  SDOH Interventions Today    Flowsheet Row Most Recent Value  SDOH Interventions   Food Insecurity Interventions Intervention Not Indicated  Transportation Interventions Intervention Not Indicated        Care Coordination Interventions Activated:  Yes  Care Coordination Interventions:  Yes, provided   Follow up plan: No further intervention required.   Encounter Outcome:  Pt. Visit Completed   Irving Shows [facility], BSN RN Case Manager [phone]

## 2022-03-28 DIAGNOSIS — Z961 Presence of intraocular lens: Secondary | ICD-10-CM | POA: Diagnosis not present

## 2022-04-30 ENCOUNTER — Ambulatory Visit: Payer: Medicare HMO | Admitting: Nurse Practitioner

## 2022-05-15 ENCOUNTER — Ambulatory Visit (INDEPENDENT_AMBULATORY_CARE_PROVIDER_SITE_OTHER): Payer: Medicare HMO | Admitting: Nurse Practitioner

## 2022-05-15 ENCOUNTER — Encounter: Payer: Self-pay | Admitting: Nurse Practitioner

## 2022-05-15 VITALS — BP 138/90 | HR 74 | Temp 97.8°F | Wt 184.6 lb

## 2022-05-15 DIAGNOSIS — R7989 Other specified abnormal findings of blood chemistry: Secondary | ICD-10-CM | POA: Diagnosis not present

## 2022-05-15 DIAGNOSIS — E7800 Pure hypercholesterolemia, unspecified: Secondary | ICD-10-CM

## 2022-05-15 DIAGNOSIS — Z23 Encounter for immunization: Secondary | ICD-10-CM | POA: Diagnosis not present

## 2022-05-15 DIAGNOSIS — I10 Essential (primary) hypertension: Secondary | ICD-10-CM

## 2022-05-15 DIAGNOSIS — Z7189 Other specified counseling: Secondary | ICD-10-CM | POA: Insufficient documentation

## 2022-05-15 DIAGNOSIS — Z1159 Encounter for screening for other viral diseases: Secondary | ICD-10-CM | POA: Diagnosis not present

## 2022-05-15 DIAGNOSIS — Z0000 Encounter for general adult medical examination without abnormal findings: Secondary | ICD-10-CM | POA: Diagnosis not present

## 2022-05-15 MED ORDER — LOSARTAN POTASSIUM 25 MG PO TABS: 12.5000 mg | ORAL_TABLET | Freq: Every day | ORAL | 1 refills | Status: DC

## 2022-05-15 NOTE — Progress Notes (Signed)
BP (!) 149/78   Pulse 74   Temp 97.8 F (36.6 C) (Oral)   Wt 184 lb 9.6 oz (83.7 kg)   SpO2 96%   BMI 33.76 kg/m    Subjective:    Patient ID: Caitlyn Miranda, female    DOB: 11-25-1945, 76 y.o.   MRN: [id number]  HPI: Caitlyn Miranda is a 76 y.o. female presenting on 05/15/2022 for comprehensive medical examination. Current medical complaints include:none  She currently lives with: Menopausal Symptoms: no  HYPERTENSION with Chronic Kidney Disease Hypertension status: controlled  Satisfied with current treatment? yes Duration of hypertension: years BP monitoring frequency:  daily BP range: 132/78 BP medication side effects:  no Medication compliance: excellent compliance Previous BP meds:losartan (cozaar) Aspirin: no Recurrent headaches: no Visual changes: no Palpitations: no Dyspnea: no Chest pain: no Lower extremity edema: no Dizzy/lightheaded: no  Functional Status Survey:       05/15/2022    2:59 PM 01/27/2022   10:10 AM 12/27/2021    8:32 AM 09/16/2017   11:12 AM  Fall Risk   Falls in the past year? 0 0 0 No  Number falls in past yr: 0 0 0   Injury with Fall? 0 0 0   Risk for fall due to : No Fall Risks No Fall Risks No Fall Risks   Follow up Falls evaluation completed Falls evaluation completed Falls evaluation completed     Depression Screen    05/15/2022    2:59 PM 01/27/2022   10:11 AM 08/24/2017   10:04 AM  Depression screen PHQ 2/9  Decreased Interest 0 0 0  Down, Depressed, Hopeless 0 0 0  PHQ - 2 Score 0 0 0  Altered sleeping 0 1 1  Tired, decreased energy _0 Change in appetite 0 0 0  Feeling bad or failure about yourself  0 0 0  Trouble concentrating 0 0 1  Moving slowly or fidgety/restless 0 0 1  Suicidal thoughts 0 0 0  PHQ-9 Score _1 Difficult doing work/chores Not difficult at all Somewhat difficult      Advanced Directives Does patient have a HCPOA?    no If yes, name and contact information: Her son Does patient  have a living will or MOST form?  no  Past Medical History:  Past Medical History:  Diagnosis Date   Gallstones    GERD (gastroesophageal reflux disease)    Wears dentures    partial upper and lower    Surgical History:  Past Surgical History:  Procedure Laterality Date   CATARACT EXTRACTION W/PHACO Right 08/14/2021   Procedure: CATARACT EXTRACTION PHACO AND INTRAOCULAR LENS PLACEMENT ([facility]) RIGHT 28.18 02:41.5;  Surgeon: Leandrew Koyanagi, MD;  Location: [facility];  Service: Ophthalmology;  Laterality: Right;   CATARACT EXTRACTION W/PHACO Left 08/28/2021   Procedure: CATARACT EXTRACTION PHACO AND INTRAOCULAR LENS PLACEMENT ([facility]) LEFT 17.60 01:34.3;  Surgeon: Leandrew Koyanagi, MD;  Location: [facility];  Service: Ophthalmology;  Laterality: Left;   MASTECTOMY Right 1989    Medications:  Current Outpatient Medications on File Prior to Visit  Medication Sig   famotidine (PEPCID) 10 MG tablet Take 10 mg by mouth 2 (two) times daily as needed for heartburn or indigestion.   Multiple Vitamins-Minerals (MULTIVITAMIN GUMMIES ADULT PO) Take by mouth. 2 gummies daily   No current facility-administered medications on file prior to visit.    Allergies:  Allergies  Allergen Reactions   Biaxin [Clarithromycin] Other (See Comments)  ulcers   Codeine    Iodinated Contrast Media Rash    Social History:  Social History   Socioeconomic History   Marital status: Widowed    Spouse name: Not on file   Number of children: Not on file   Years of education: Not on file   Highest education level: Not on file  Occupational History   Not on file  Tobacco Use   Smoking status: Never   Smokeless tobacco: Never  Vaping Use   Vaping Use: Never used  Substance and Sexual Activity   Alcohol use: Yes    Comment: rarely   Drug use: No   Sexual activity: Not Currently  Other Topics Concern   Not on file  Social History Narrative   Not on file   Social Determinants  of Health   Financial Resource Strain: Not on file  Food Insecurity: No Food Insecurity (02/18/2022)   Hunger Vital Sign    Worried About Running Out of Food in the Last Year: Never true    Ran Out of Food in the Last Year: Never true  Transportation Needs: No Transportation Needs (02/18/2022)   PRAPARE - Hydrologist (Medical): No    Lack of Transportation (Non-Medical): No  Physical Activity: Not on file  Stress: Not on file  Social Connections: Not on file  Intimate Partner Violence: Not on file   Social History   Tobacco Use  Smoking Status Never  Smokeless Tobacco Never   Social History   Substance and Sexual Activity  Alcohol Use Yes   Comment: rarely    Family History:  Family History  Problem Relation Age of Onset   Hypertension Mother    Hypertension Father    Heart disease Father    Diabetes Father    Lung disease Father    Cancer Sister        breast   Pancreatitis Maternal Grandmother    Hypertension Sister     Past medical history, surgical history, medications, allergies, family history and social history reviewed with patient today and changes made to appropriate areas of the chart.   Review of Systems  Eyes:  Negative for blurred vision and double vision.  Respiratory:  Negative for shortness of breath.   Cardiovascular:  Negative for chest pain, palpitations and leg swelling.  Neurological:  Negative for dizziness and headaches.    All other ROS negative except what is listed above and in the HPI.      Objective:    BP (!) 149/78   Pulse 74   Temp 97.8 F (36.6 C) (Oral)   Wt 184 lb 9.6 oz (83.7 kg)   SpO2 96%   BMI 33.76 kg/m   Wt Readings from Last 3 Encounters:  05/15/22 184 lb 9.6 oz (83.7 kg)  01/27/22 185 lb 1.6 oz (84 kg)  12/27/21 186 lb 9.6 oz (84.6 kg)    No results found.  Physical Exam Vitals and nursing note reviewed.  Constitutional:      General: She is not in acute distress.     Appearance: Normal appearance. She is normal weight. She is not ill-appearing, toxic-appearing or diaphoretic.  HENT:     Head: Normocephalic.     Right Ear: External ear normal.     Left Ear: External ear normal.     Nose: Nose normal.     Mouth/Throat:     Mouth: Mucous membranes are moist.     Pharynx: Oropharynx is  clear.  Eyes:     General:        Right eye: No discharge.        Left eye: No discharge.     Extraocular Movements: Extraocular movements intact.     Conjunctiva/sclera: Conjunctivae normal.     Pupils: Pupils are equal, round, and reactive to light.  Cardiovascular:     Rate and Rhythm: Normal rate and regular rhythm.     Heart sounds: No murmur heard. Pulmonary:     Effort: Pulmonary effort is normal. No respiratory distress.     Breath sounds: Normal breath sounds. No wheezing or rales.  Musculoskeletal:     Cervical back: Normal range of motion and neck supple.  Skin:    General: Skin is warm and dry.     Capillary Refill: Capillary refill takes less than 2 seconds.  Neurological:     General: No focal deficit present.     Mental Status: She is alert and oriented to person, place, and time. Mental status is at baseline.  Psychiatric:        Mood and Affect: Mood normal.        Behavior: Behavior normal.        Thought Content: Thought content normal.        Judgment: Judgment normal.         No data to display          Cognitive Testing - 6-CIT  Correct? Score   What year is it? yes 0 Yes = 0    No = 4  What month is it? yes 0 Yes = 0    No = 3  Remember:     [address]ia [address], [place], [place]     What time is it? yes 0 Yes = 0    No = 3  Count backwards from 20 to 1 yes 0 Correct = 0    1 error = 2   More than 1 error = 4  Say the months of the year in reverse. yes 0 Correct = 0    1 error = 2   More than 1 error = 4  What address did I ask you to remember? yes 2 Correct = 0  1 error = 2    2 error = 4    3 error = 6    4 error = 8    All  wrong = 10       TOTAL SCORE  2/28   Interpretation:  Normal  Normal (0-7) Abnormal (8-28)   Results for orders placed or performed in visit on 02/03/22  Thyroid peroxidase antibody  Result Value Ref Range   Thyroperoxidase Ab SerPl-aCnc 9 0 - 34 IU/mL  Thyroid Panel With TSH  Result Value Ref Range   TSH 4.600 (H) 0.450 - 4.500 uIU/mL   T4, Total 7.3 4.5 - 12.0 ug/dL   T3 Uptake Ratio 22 (L) 24 - 39 %   Free Thyroxine Index 1.6 1.2 - 4.9      Assessment & Plan:   Problem List Items Addressed This Visit       Cardiovascular and Mediastinum   Hypertension    Chronic.  Controlled.  Continue with current medication regimen of Losartan 12.71m.  Refill sent today.  Labs ordered today.  Return to clinic in 6 months for reevaluation.  Call sooner if concerns arise.        Relevant Medications   losartan (COZAAR)  25 MG tablet   Other Relevant Orders   Comp Met (CMET)     Other   Elevated TSH    Labs ordered at visit today.  Will make recommendations based on lab results.        Relevant Orders   TSH   T4, free   Hypercholesteremia    Chronic.  Controlled.  Continue with current medication regimen.  Labs ordered today.  Return to clinic in 6 months for reevaluation.  Call sooner if concerns arise.        Relevant Medications   losartan (COZAAR) 25 MG tablet   Other Relevant Orders   Lipid Profile   Advanced care planning/counseling discussion    A voluntary discussion about advance care planning including the explanation and discussion of advance directives was extensively discussed  with the patient for 10 minutes with patient.  Explanation about the health care proxy and Living will was reviewed and packet with forms with explanation of how to fill them out was given.  During this discussion, the patient was able to identify a health care proxy as her son and plans to fill out the paperwork required.  Patient was offered a separate [place] visit for further  assistance with forms.         Other Visit Diagnoses     Encounter for annual wellness exam in Medicare patient    -  Primary   Encounter for hepatitis C screening test for low risk patient       Relevant Orders   Hepatitis C Antibody   Need for vaccination against Streptococcus pneumoniae       Relevant Orders   Pneumococcal conjugate vaccine 20-valent (Prevnar 20)        Preventative Services:  AAA screening: NA Health Risk Assessment and Personalized Prevention Plan: Up to date Bone Mass Measurements: Refused Breast Cancer Screening: NA CVD Screening: Up to date Cervical Cancer Screening: NA Colon Cancer Screening: NA Depression Screening: Done today Diabetes Screening: Done today Glaucoma Screening: Up to date Hepatitis B vaccine: NA Hepatitis C screening: Up to date HIV Screening: Up to date Flu Vaccine: Refused Lung cancer Screening: NA Obesity Screening: Done today Pneumonia Vaccines (2): Up to date STI Screening: NA  Follow up plan: Return in about 6 months (around 11/13/2022) for HTN, HLD, DM2 FU.   LABORATORY TESTING:  - Pap smear: not applicable  IMMUNIZATIONS:   - Tdap: Tetanus vaccination status reviewed: Medicare. - Influenza: Refused - Pneumovax: NA - Prevnar: Given today - Zostavax vaccine: Refused  SCREENING: -Mammogram: Not applicable  - Colonoscopy: Not applicable  - Bone Density: Refused  -Hearing Test: Not applicable  -Spirometry: Not applicable   PATIENT COUNSELING:   Advised to take 1 mg of folate supplement per day if capable of pregnancy.   Sexuality: Discussed sexually transmitted diseases, partner selection, use of condoms, avoidance of unintended pregnancy  and contraceptive alternatives.   Advised to avoid cigarette smoking.  I discussed with the patient that most people either abstain from alcohol or drink within safe limits (<=14/week and <=4 drinks/occasion for males, <=7/weeks and <= 3 drinks/occasion for females) and  that the risk for alcohol disorders and other health effects rises proportionally with the number of drinks per week and how often a drinker exceeds daily limits.  Discussed cessation/primary prevention of drug use and availability of treatment for abuse.   Diet: Encouraged to adjust caloric intake to maintain  or achieve ideal body weight, to reduce intake  of dietary saturated fat and total fat, to limit sodium intake by avoiding high sodium foods and not adding table salt, and to maintain adequate dietary potassium and calcium preferably from fresh fruits, vegetables, and low-fat dairy products.    stressed the importance of regular exercise  Injury prevention: Discussed safety belts, safety helmets, smoke detector, smoking near bedding or upholstery.   Dental health: Discussed importance of regular tooth brushing, flossing, and dental visits.    NEXT PREVENTATIVE PHYSICAL DUE IN 1 YEAR. Return in about 6 months (around 11/13/2022) for HTN, HLD, DM2 FU.

## 2022-05-15 NOTE — Assessment & Plan Note (Signed)
Chronic.  Controlled.  Continue with current medication regimen.  Labs ordered today.  Return to clinic in 6 months for reevaluation.  Call sooner if concerns arise.  ? ?

## 2022-05-15 NOTE — Assessment & Plan Note (Signed)
Labs ordered at visit today.  Will make recommendations based on lab results.   

## 2022-05-15 NOTE — Assessment & Plan Note (Signed)
A voluntary discussion about advance care planning including the explanation and discussion of advance directives was extensively discussed with the patient for 10 minutes with patient.  Explanation about the health care proxy and Living will was reviewed and packet with forms with explanation of how to fill them out was given.  During this discussion, the patient was able to identify a health care proxy as her son and plans to fill out the paperwork required.  Patient was offered a separate Advance Care Planning visit for further assistance with forms.    

## 2022-05-15 NOTE — Assessment & Plan Note (Signed)
Chronic.  Controlled.  Continue with current medication regimen of Losartan 12.5mg .  Refill sent today.  Labs ordered today.  Return to clinic in 6 months for reevaluation.  Call sooner if concerns arise.

## 2022-05-16 LAB — COMPREHENSIVE METABOLIC PANEL
ALT: 13 IU/L (ref 0–32)
AST: 16 IU/L (ref 0–40)
Albumin/Globulin Ratio: 2.3 — ABNORMAL HIGH (ref 1.2–2.2)
Albumin: 4.8 g/dL (ref 3.8–4.8)
Alkaline Phosphatase: 72 IU/L (ref 44–121)
BUN/Creatinine Ratio: 14 (ref 12–28)
BUN: 13 mg/dL (ref 8–27)
Bilirubin Total: 0.4 mg/dL (ref 0.0–1.2)
CO2: 24 mmol/L (ref 20–29)
Calcium: 10.2 mg/dL (ref 8.7–10.3)
Chloride: 101 mmol/L (ref 96–106)
Creatinine, Ser: 0.9 mg/dL (ref 0.57–1.00)
Globulin, Total: 2.1 g/dL (ref 1.5–4.5)
Glucose: 98 mg/dL (ref 70–99)
Potassium: 4.1 mmol/L (ref 3.5–5.2)
Sodium: 137 mmol/L (ref 134–144)
Total Protein: 6.9 g/dL (ref 6.0–8.5)
eGFR: 66 mL/min/{1.73_m2} (ref >59)

## 2022-05-16 LAB — HEPATITIS C ANTIBODY: Hep C Virus Ab: NONREACTIVE

## 2022-05-16 LAB — LIPID PANEL
Chol/HDL Ratio: 3.4 ratio (ref 0.0–4.4)
Cholesterol, Total: 236 mg/dL — ABNORMAL HIGH (ref 100–199)
HDL: 69 mg/dL (ref >39)
LDL Chol Calc (NIH): 137 mg/dL — ABNORMAL HIGH (ref 0–99)
Triglycerides: 169 mg/dL — ABNORMAL HIGH (ref 0–149)
VLDL Cholesterol Cal: 30 mg/dL (ref 5–40)

## 2022-05-16 LAB — TSH: TSH: 4.92 u[IU]/mL — ABNORMAL HIGH (ref 0.450–4.500)

## 2022-05-16 LAB — T4, FREE: Free T4: 0.97 ng/dL (ref 0.82–1.77)

## 2022-05-16 NOTE — Progress Notes (Signed)
Please let patient know that her cholesterol is elevated.  Her cardiac risk score puts her at high risk of having a stroke or heart attack over the next 10 years.  I recommend that she start a statin called crestor 5mg  daily.  The goal will be to increase this to 20mg  daily if patient tolerates it well.  If she agrees to the medication I can send it to the pharmacy.  .  Otherwise, her blood work looks good.  TSH (thyroid hormone) is slightly elevated but her T4 is within normal range so there is no need for treatment at this time.  Follow up as discussed.   The 10-year ASCVD risk score (Arnett DK, et al., 2019) is: 27%   Values used to calculate the score:     Age: 76 years     Sex: Female     Is Non-Hispanic African American: No     Diabetic: No     Tobacco smoker: No     Systolic Blood Pressure: 138 mmHg     Is BP treated: Yes     HDL Cholesterol: 69 mg/dL     Total Cholesterol: 236 mg/dL

## 2022-11-13 ENCOUNTER — Ambulatory Visit: Payer: Medicare HMO | Admitting: Nurse Practitioner

## 2022-11-19 ENCOUNTER — Ambulatory Visit (INDEPENDENT_AMBULATORY_CARE_PROVIDER_SITE_OTHER): Payer: Medicare HMO | Admitting: Nurse Practitioner

## 2022-11-19 ENCOUNTER — Encounter: Payer: Self-pay | Admitting: Nurse Practitioner

## 2022-11-19 VITALS — BP 145/87 | HR 75 | Temp 97.9°F | Wt 183.0 lb

## 2022-11-19 DIAGNOSIS — I10 Essential (primary) hypertension: Secondary | ICD-10-CM | POA: Diagnosis not present

## 2022-11-19 DIAGNOSIS — M79671 Pain in right foot: Secondary | ICD-10-CM

## 2022-11-19 DIAGNOSIS — R7989 Other specified abnormal findings of blood chemistry: Secondary | ICD-10-CM

## 2022-11-19 DIAGNOSIS — E7800 Pure hypercholesterolemia, unspecified: Secondary | ICD-10-CM

## 2022-11-19 MED ORDER — METHYLPREDNISOLONE 4 MG PO TBPK: ORAL_TABLET | ORAL | 0 refills | Status: DC

## 2022-11-19 MED ORDER — LISINOPRIL 10 MG PO TABS: 10.0000 mg | ORAL_TABLET | Freq: Every day | ORAL | 0 refills | Status: DC

## 2022-11-19 NOTE — Assessment & Plan Note (Signed)
Chronic. Not well controlled.  Losartan caused fatigue.  Will change to Lisinopril 10mg .  Side effects and benefits of medication discussed during visit.  Labs ordered today.  Follow up in 3 months.  Call sooner if concerns arise.

## 2022-11-19 NOTE — Assessment & Plan Note (Signed)
Labs ordered at visit today.  Will make recommendations based on lab results.   

## 2022-11-19 NOTE — Progress Notes (Addendum)
BP (!) 145/87   Pulse 75   Temp 97.9 F (36.6 C) (Oral)   Wt 183 lb (83 kg)   SpO2 98%   BMI 33.47 kg/m    Subjective:    Patient ID: Caitlyn Miranda, female    DOB: 08/09/1945, 77 y.o.   MRN: [id number]  HPI: Caitlyn Miranda is a 77 y.o. female  Chief Complaint  Patient presents with   Hypertension    Pt states that she hasn't took the BP medication for a month because it made her tired   Hyperlipidemia   HYPERTENSION Patient states she take take the losartan for a little while.  It was causing her too much fatigue and she wasn't able to work.   Hypertension status: uncontrolled  Satisfied with current treatment? no Duration of hypertension: years BP monitoring frequency:  daily BP range: 125-140/70 BP medication side effects:  no Medication compliance: not taking  Previous BP meds:losartan (cozaar) Aspirin: no Recurrent headaches: no Visual changes: no Palpitations: no Dyspnea: no Chest pain: no Lower extremity edema: no Dizzy/lightheaded: no  Patient states she has heel pain which makes her walk funny and wondering if there is anything she can do to get it better.  Hurts more when she wakes up in the morning.  [facility] for breast prosthesis.   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  Did not have reconstructive surgery.  She has been using a prosthesis with a bra for the last several years.  She is due for a replacement.    Relevant past medical, surgical, family and social history reviewed and updated as indicated. Interim medical history since our last visit reviewed. Allergies and medications reviewed and updated.  Review of Systems  Eyes:  Negative for visual disturbance.  Respiratory:  Negative for cough, chest tightness and shortness of breath.   Cardiovascular:  Negative for chest pain, palpitations and leg swelling.  Musculoskeletal:        Right heel pain  Neurological:  Negative for dizziness and headaches.    Per HPI unless specifically  indicated above     Objective:    BP (!) 145/87   Pulse 75   Temp 97.9 F (36.6 C) (Oral)   Wt 183 lb (83 kg)   SpO2 98%   BMI 33.47 kg/m   Wt Readings from Last 3 Encounters:  11/19/22 183 lb (83 kg)  05/15/22 184 lb 9.6 oz (83.7 kg)  01/27/22 185 lb 1.6 oz (84 kg)    Physical Exam Vitals and nursing note reviewed.  Constitutional:      General: She is not in acute distress.    Appearance: Normal appearance. She is normal weight. She is not ill-appearing, toxic-appearing or diaphoretic.  HENT:     Head: Normocephalic.     Right Ear: External ear normal.     Left Ear: External ear normal.     Nose: Nose normal.     Mouth/Throat:     Mouth: Mucous membranes are moist.     Pharynx: Oropharynx is clear.  Eyes:     General:        Right eye: No discharge.        Left eye: No discharge.     Extraocular Movements: Extraocular movements intact.     Conjunctiva/sclera: Conjunctivae normal.     Pupils: Pupils are equal, round, and reactive to light.  Cardiovascular:     Rate and Rhythm: Normal rate and regular rhythm.     Heart sounds:  No murmur heard. Pulmonary:     Effort: Pulmonary effort is normal. No respiratory distress.     Breath sounds: Normal breath sounds. No wheezing or rales.  Musculoskeletal:     Cervical back: Normal range of motion and neck supple.  Skin:    General: Skin is warm and dry.     Capillary Refill: Capillary refill takes less than 2 seconds.  Neurological:     General: No focal deficit present.     Mental Status: She is alert and oriented to person, place, and time. Mental status is at baseline.  Psychiatric:        Mood and Affect: Mood normal.        Behavior: Behavior normal.        Thought Content: Thought content normal.        Judgment: Judgment normal.     Results for orders placed or performed in visit on 05/15/22  Comp Met (CMET)  Result Value Ref Range   Glucose 98 70 - 99 mg/dL   BUN 13 8 - 27 mg/dL   Creatinine, Ser 0.98  0.57 - 1.00 mg/dL   eGFR 66 >11 BJ/YNW/2.95   BUN/Creatinine Ratio 14 12 - 28   Sodium 137 134 - 144 mmol/L   Potassium 4.1 3.5 - 5.2 mmol/L   Chloride 101 96 - 106 mmol/L   CO2 24 20 - 29 mmol/L   Calcium 10.2 8.7 - 10.3 mg/dL   Total Protein 6.9 6.0 - 8.5 g/dL   Albumin 4.8 3.8 - 4.8 g/dL   Globulin, Total 2.1 1.5 - 4.5 g/dL   Albumin/Globulin Ratio 2.3 (H) 1.2 - 2.2   Bilirubin Total 0.4 0.0 - 1.2 mg/dL   Alkaline Phosphatase 72 44 - 121 IU/L   AST 16 0 - 40 IU/L   ALT 13 0 - 32 IU/L  Lipid Profile  Result Value Ref Range   Cholesterol, Total 236 (H) 100 - 199 mg/dL   Triglycerides 621 (H) 0 - 149 mg/dL   HDL 69 >30 mg/dL   VLDL Cholesterol Cal 30 5 - 40 mg/dL   LDL Chol Calc (NIH) 865 (H) 0 - 99 mg/dL   Chol/HDL Ratio 3.4 0.0 - 4.4 ratio  Hepatitis C Antibody  Result Value Ref Range   Hep C Virus Ab Non Reactive Non Reactive  TSH  Result Value Ref Range   TSH 4.920 (H) 0.450 - 4.500 uIU/mL  T4, free  Result Value Ref Range   Free T4 0.97 0.82 - 1.77 ng/dL      Assessment & Plan:   Problem List Items Addressed This Visit       Cardiovascular and Mediastinum   Hypertension    Chronic. Not well controlled.  Losartan caused fatigue.  Will change to Lisinopril 10mg .  Side effects and benefits of medication discussed during visit.  Labs ordered today.  Follow up in 3 months.  Call sooner if concerns arise.       Relevant Medications   lisinopril (ZESTRIL) 10 MG tablet   Other Relevant Orders   Comp Met (CMET)     Other   Elevated TSH    Labs ordered at visit today.  Will make recommendations based on lab results.        Relevant Orders   TSH   T4, free   Hypercholesteremia - Primary    Labs ordered at visit today.  Will make recommendations based on lab results.        Relevant  Medications   lisinopril (ZESTRIL) 10 MG tablet   Other Relevant Orders   Lipid Profile   Other Visit Diagnoses     Pain of right heel       Relevant Orders   DG Foot  Complete Right        Follow up plan: Return in about 3 months (around 02/19/2023) for BP Check.

## 2022-11-20 LAB — COMPREHENSIVE METABOLIC PANEL
ALT: 12 IU/L (ref 0–32)
AST: 18 IU/L (ref 0–40)
Albumin/Globulin Ratio: 2.1 (ref 1.2–2.2)
Albumin: 4.7 g/dL (ref 3.8–4.8)
Alkaline Phosphatase: 77 IU/L (ref 44–121)
BUN/Creatinine Ratio: 14 (ref 12–28)
BUN: 11 mg/dL (ref 8–27)
Bilirubin Total: 0.5 mg/dL (ref 0.0–1.2)
CO2: 23 mmol/L (ref 20–29)
Calcium: 10 mg/dL (ref 8.7–10.3)
Chloride: 101 mmol/L (ref 96–106)
Creatinine, Ser: 0.81 mg/dL (ref 0.57–1.00)
Globulin, Total: 2.2 g/dL (ref 1.5–4.5)
Glucose: 99 mg/dL (ref 70–99)
Potassium: 4.2 mmol/L (ref 3.5–5.2)
Sodium: 140 mmol/L (ref 134–144)
Total Protein: 6.9 g/dL (ref 6.0–8.5)
eGFR: 75 mL/min/{1.73_m2} (ref >59)

## 2022-11-20 LAB — LIPID PANEL
Chol/HDL Ratio: 3.6 ratio (ref 0.0–4.4)
Cholesterol, Total: 241 mg/dL — ABNORMAL HIGH (ref 100–199)
HDL: 67 mg/dL (ref >39)
LDL Chol Calc (NIH): 147 mg/dL — ABNORMAL HIGH (ref 0–99)
Triglycerides: 154 mg/dL — ABNORMAL HIGH (ref 0–149)
VLDL Cholesterol Cal: 27 mg/dL (ref 5–40)

## 2022-11-20 LAB — T4, FREE: Free T4: 1.07 ng/dL (ref 0.82–1.77)

## 2022-11-20 LAB — TSH: TSH: 4.43 u[IU]/mL (ref 0.450–4.500)

## 2022-11-20 NOTE — Progress Notes (Signed)
Please let patient know that her cholesterol is elevated.  Her cardiac risk score puts he at high risk of having a stroke or heart attack over the next 10 years.  I recommend that she start a statin called crestor 5mg  daily.  The goal will be to increase this to 20mg  daily if patient tolerates it well.  If she agrees to the medication I can send it to the pharmacy.    Otherwise, her lab work looks good.  No other concerns at this time.

## 2022-12-03 NOTE — Telephone Encounter (Unsigned)
Copied from CRM [id number]. Topic: General - Other >> Dec 03, 2022  3:48 PM Carrielelia G wrote: Status of paperwork : [facility] [employer]: for Bra and prosthesis  Please call patient and advise. Thank you

## 2022-12-09 ENCOUNTER — Ambulatory Visit: Payer: Self-pay

## 2022-12-09 NOTE — Telephone Encounter (Signed)
  Chief Complaint: muscle spasms and cramping Symptoms: muscle spasms and cramping that come and go to her hands, legs feet ribcage Frequency: yesterday  Pertinent Negatives: Patient denies pain, chest pain, SOB rash  Disposition: [] ED /[] Urgent Care (no appt availability in office) / [] Appointment(In office/virtual)/ []  [place]/ [] Home Care/ [x] Refused Recommended Disposition /[] [place] [facility]/ []  Follow-up with PCP Additional Notes: Pt stated she is now taking half the dose of lisinopril due to BP "low". Readings 111-115/70-75.  Reason for Disposition  [1] New-onset muscle jerks AND [2] unexplained AND [3] 3 or more times/day  Answer Assessment - Initial Assessment Questions 1. ONSET: "When did the muscle aches or body pains start?"      Yesterday  2. LOCATION: "What part of your body is hurting?" (e.g., entire body, arms, legs)      Hands, legs, rib cage, feet,  3. SEVERITY: "How bad is the pain?" (Scale 1-10; or mild, moderate, severe)   - MILD (1-3): doesn't interfere with normal activities    - MODERATE (4-7): interferes with normal activities or awakens from sleep    - SEVERE (8-10):  excruciating pain, unable to do any normal activities      No pain 4. CAUSE: "What do you think is causing the pains?"     Does not know BP med? 5. FEVER: "Have you been having fever?"     no 6. OTHER SYMPTOMS: "Do you have any other symptoms?" (e.g., chest pain, weakness, rash, cold or flu symptoms, weight loss)     no  Answer Assessment - Initial Assessment Questions 1. APPEARANCE of MOVEMENT: "What did the jerking or twitching look like?" (e.g., body area)     Hands lock up  2. ONSET: "When did this start happening?" (e.g., hours, days, weeks, months ago)     Yesterday  3. DURATION: "How long does the jerk, twitch, or spasm last?"     Comes and goes  4. FREQUENCY:  "How often does this happen?"      Frequently started with cramping to leg 5. WHEN: "When does this  happen?" (e.g., while awake, while falling asleep, while sleeping)     Sleeping and awake 6. CAUSE: "What do you think caused the spasms/cramping?"     Pt thought it may have ben the lisinopril  7. OTHER SYMPTOMS: "Are there any other symptoms?" (e.g., fever, headache)     no  Protocols used: Muscle Aches and Body Pain-A-AH, Muscle Jerks - Tics - [facility]

## 2022-12-10 NOTE — Telephone Encounter (Signed)
Called patient and she stated she was going out of town, the cramps had got better.  She said that she would call the pharmacy and ask the questions that she has pertaining to them medication.  Also stated she would see how she feels for the rest of the week and if she needs an appointment before going out of town then she would call us back.

## 2022-12-11 DIAGNOSIS — Z4431 Encounter for fitting and adjustment of external right breast prosthesis: Secondary | ICD-10-CM | POA: Diagnosis not present

## 2022-12-11 DIAGNOSIS — Z9011 Acquired absence of right breast and nipple: Secondary | ICD-10-CM | POA: Diagnosis not present

## 2022-12-11 DIAGNOSIS — C50911 Malignant neoplasm of unspecified site of right female breast: Secondary | ICD-10-CM | POA: Diagnosis not present

## 2022-12-24 DIAGNOSIS — M722 Plantar fascial fibromatosis: Secondary | ICD-10-CM | POA: Diagnosis not present

## 2022-12-24 DIAGNOSIS — M2241 Chondromalacia patellae, right knee: Secondary | ICD-10-CM | POA: Diagnosis not present

## 2023-02-19 ENCOUNTER — Ambulatory Visit: Payer: Medicare HMO | Admitting: Physician Assistant

## 2023-03-31 DIAGNOSIS — H04123 Dry eye syndrome of bilateral lacrimal glands: Secondary | ICD-10-CM | POA: Diagnosis not present

## 2023-03-31 DIAGNOSIS — H43813 Vitreous degeneration, bilateral: Secondary | ICD-10-CM | POA: Diagnosis not present

## 2023-03-31 DIAGNOSIS — Z961 Presence of intraocular lens: Secondary | ICD-10-CM | POA: Diagnosis not present
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
## 2023-11-12 ENCOUNTER — Encounter: Payer: Self-pay | Admitting: Pediatrics

## 2023-11-12 ENCOUNTER — Ambulatory Visit (INDEPENDENT_AMBULATORY_CARE_PROVIDER_SITE_OTHER): Payer: Self-pay | Admitting: Pediatrics

## 2023-11-12 VITALS — BP 130/81 | HR 76 | Temp 97.7°F | Wt 180.0 lb

## 2023-11-12 DIAGNOSIS — M25561 Pain in right knee: Secondary | ICD-10-CM

## 2023-11-12 DIAGNOSIS — R42 Dizziness and giddiness: Secondary | ICD-10-CM

## 2023-11-12 DIAGNOSIS — R002 Palpitations: Secondary | ICD-10-CM | POA: Diagnosis not present

## 2023-11-12 DIAGNOSIS — H61893 Other specified disorders of external ear, bilateral: Secondary | ICD-10-CM

## 2023-11-12 MED ORDER — HYDROCORTISONE-ACETIC ACID 1-2 % OT SOLN: 5.0000 [drp] | Freq: Four times a day (QID) | OTIC | 0 refills | Status: AC

## 2023-11-12 MED ORDER — METHYLPREDNISOLONE 4 MG PO TBPK: ORAL_TABLET | ORAL | 0 refills | Status: DC

## 2023-11-12 NOTE — Progress Notes (Signed)
 Office Visit  BP 130/81   Pulse 76   Temp 97.7 F (36.5 C) (Oral)   Wt 180 lb (81.6 kg)   SpO2 97%   BMI 32.92 kg/m    Subjective:    Patient ID: Caitlyn Miranda, female    DOB: 16-Feb-1946, 78 y.o.   MRN: [id number]  HPI: Caitlyn Miranda is a 78 y.o. female  Chief Complaint  Patient presents with   Knee Pain    Discussed the use of AI scribe software for clinical note transcription with the patient, who gave verbal consent to proceed.  History of Present Illness   Caitlyn Miranda is a 78 year old female who presents with knee pain and dizziness.  She experiences persistent knee pain, primarily in the right knee, for which she has previously received a prednisone injection at an orthopedic clinic. Oral prednisone has provided more relief than the injection. Additionally, she has pain in her shoulder and hand, with her fingers occasionally freezing up, which she attributes to overuse from hand sewing. She takes extra strength Tylenol  for knee pain and occasionally uses turmeric for joint health.  She describes experiencing dizzy spells that began last week, primarily occurring in the mornings and sometimes at night. These episodes are characterized by a sensation of the room spinning and a feeling of nausea, similar to vertigo she has experienced in the past. She has a history of sinus infections and vertigo. Her sister was recently diagnosed with atrial fibrillation, which raises her concern about her own symptoms. She reports heart racing during dizzy spells but denies shortness of breath. Some dizziness occurs when using reading glasses while sewing.  She mentions having cataract surgery a couple of years ago and uses reading glasses, noting that her eyesight has changed. She has a history of high blood pressure but does not take lisinopril  due to it causing her blood pressure to drop too low. Instead, she manages her blood pressure with beet juice.  She has had adverse reactions to  medications in the past, including a severe rash from a CT scan contrast and a prolonged recovery of energy levels post-COVID infection and vaccination. She reports a stressful year due to a prolonged home repair situation involving a bathroom leak and asbestos removal, which has contributed to her stress levels. She also mentions a family history of cardiomyopathy, as her late husband had the condition.        Relevant past medical, surgical, family and social history reviewed and updated as indicated. Interim medical history since our last visit reviewed. Allergies and medications reviewed and updated.  ROS per HPI unless specifically indicated above     Objective:     BP 130/81   Pulse 76   Temp 97.7 F (36.5 C) (Oral)   Wt 180 lb (81.6 kg)   SpO2 97%   BMI 32.92 kg/m   Wt Readings from Last 3 Encounters:  11/12/23 180 lb (81.6 kg)  11/19/22 183 lb (83 kg)  05/15/22 184 lb 9.6 oz (83.7 kg)     Physical Exam Constitutional:      Appearance: Normal appearance.  HENT:     Head: Normocephalic and atraumatic.  Eyes:     Pupils: Pupils are equal, round, and reactive to light.  Cardiovascular:     Rate and Rhythm: Normal rate and regular rhythm.     Pulses: Normal pulses.     Heart sounds: Normal heart sounds.  Pulmonary:     Effort: Pulmonary effort is normal.  Breath sounds: Normal breath sounds.  Abdominal:     General: Abdomen is flat.     Palpations: Abdomen is soft.  Musculoskeletal:        General: Tenderness present. Normal range of motion.     Cervical back: Normal range of motion.     Comments: Right knee bony tenderness  Skin:    General: Skin is warm and dry.     Capillary Refill: Capillary refill takes less than 2 seconds.  Neurological:     General: No focal deficit present.     Mental Status: She is alert. Mental status is at baseline.     Cranial Nerves: Cranial nerves 2-12 are intact.     Sensory: Sensation is intact.     Motor: Motor function  is intact.     Coordination: Coordination is intact.     Comments: +DHP bilaterally w/o nystagmus  Psychiatric:        Mood and Affect: Mood normal.        Behavior: Behavior normal.         11/12/2023    2:04 PM 11/19/2022    1:30 PM 05/15/2022    2:59 PM 01/27/2022   10:11 AM 08/24/2017   10:04 AM  Depression screen PHQ 2/9  Decreased Interest 0 0 0 0 0  Down, Depressed, Hopeless 0 0 0 0 0  PHQ - 2 Score 0 0 0 0 0  Altered sleeping 0 1 0 1 1  Tired, decreased energy 2 2 1 1 1   Change in appetite 1 0 0 0 0  Feeling bad or failure about yourself  1 0 0 0 0  Trouble concentrating 1 0 0 0 1  Moving slowly or fidgety/restless 0 0 0 0 1  Suicidal thoughts 0 0 0 0 0  PHQ-9 Score 5 3 1 2 4   Difficult doing work/chores Not difficult at all Not difficult at all Not difficult at all Somewhat difficult        11/12/2023    2:05 PM 11/19/2022    1:31 PM 05/15/2022    3:00 PM 01/27/2022   10:11 AM  GAD 7 : Generalized Anxiety Score  Nervous, Anxious, on Edge 1 1 0 1  Control/stop worrying 1 0 0 0  Worry too much - different things 1 1 0 1  Trouble relaxing 1 0 0 1  Restless 0 0 0 0  Easily annoyed or irritable 0 0 0 0  Afraid - awful might happen 0 0 0 0  Total GAD 7 Score 4 2 0 3  Anxiety Difficulty Somewhat difficult Not difficult at all Not difficult at all Not difficult at all       Assessment & Plan:  Assessment & Plan   Dizziness Palpitations Intermittent dizziness and vertigo, likely BPPV (positive DHP bilaterally today) or hypertension-related. No atrial fibrillation detected. Stress and reading glasses may contribute. - Instructed on Epley maneuver for BPPV. - Ordered Holter monitor for one week to assess arrhythmias. - Scheduled follow-up in three weeks for Holter review.  Acute pain of right knee Chronic knee pain likely from arthritis. Oral prednisone more effective than injection. Shoulder and hand pain possibly from overuse. - Prescribed Medrol  dose pack for  inflammation and pain relief. - Instructed on Medrol  pack use. -     methylPREDNISolone ; Follow directions on pack  Dispense: 21 each; Refill: 0  Ear canal dryness, bilateral Dry skin in ear canal, possibly eczema-related. No infection. Small ear canal complicates examination. -  Prescribed steroid ear drops for dry skin. - Advised on over-the-counter Debrox for ear cleaning. - Consider ENT referral if symptoms persist.  -     Hydrocortisone -Acetic Acid ; Place 5 drops into both ears 4 (four) times daily for 14 days. Total 14 days  Dispense: 10 mL; Refill: 0   Follow up plan: Return in about 2 weeks (around 11/26/2023) for palpitations, Chronic illness f/u.  Hadassah Letters, MD

## 2023-11-12 NOTE — Patient Instructions (Addendum)
 For the ear drops: Take 3-4 x daily for 14 days in both ears

## 2023-11-18 ENCOUNTER — Encounter: Payer: Self-pay | Admitting: Pediatrics

## 2023-11-24 ENCOUNTER — Telehealth: Payer: Self-pay | Admitting: Pediatrics

## 2023-11-24 ENCOUNTER — Ambulatory Visit: Attending: Pediatrics

## 2023-11-24 DIAGNOSIS — R002 Palpitations: Secondary | ICD-10-CM

## 2023-11-24 NOTE — Telephone Encounter (Signed)
 Pateint stopped to let Dr Juliette Oh know that she never received the heart monitor that she thought was being mailed to her. She also wants to know if she still needs to keep her appointment for June 12, since the medication that she was last prescribed "cleared up everything". She would like a call in regards to the heart monitor and keeping the appointment. Leanda [phone]

## 2023-11-24 NOTE — Telephone Encounter (Signed)
 I don't see the orders got placed.

## 2023-11-24 NOTE — Addendum Note (Signed)
 Addended by: Ajani Schnieders P on: 11/24/2023 04:21 PM   Modules accepted: Orders

## 2023-11-27 NOTE — Telephone Encounter (Signed)
 Patient aware however wondered if testing is necessary since she is now feeling better post medication she was given at last visit.

## 2023-11-27 NOTE — Telephone Encounter (Signed)
 Copied from CRM [id number]. Topic: General - Other >> Nov 27, 2023  1:49 PM Rosamond Comes wrote: Reason for CRM: patient calling asking about the holter monitor, and if she needs the monitor. Read verbatim the message from Dr Juliette Oh on 11/27/23 at 11:15 patient received monitor just now and is wondering if she will be billed and if it's still necessary.

## 2023-11-27 NOTE — Telephone Encounter (Signed)
 Discussed with patient. She did receive the monitor this morning and is concerned she may not understand how it works. I asked that she please look it over this weekend and start it if she is able but if not to please call the office early next week and I can schedule a nurse visit with her to help her through it. She agrees to this and will plan to proceed. She is also aware the upcoming appointment per provider is cancelled and when we receive results we will call her to discuss.

## 2023-11-30 NOTE — Telephone Encounter (Deleted)
 Error

## 2023-11-30 NOTE — Telephone Encounter (Signed)
 Caitlyn Miranda

## 2023-12-10 ENCOUNTER — Ambulatory Visit: Admitting: Pediatrics

## 2023-12-10 ENCOUNTER — Ambulatory Visit: Admitting: Nurse Practitioner

## 2024-04-01 DIAGNOSIS — H04123 Dry eye syndrome of bilateral lacrimal glands: Secondary | ICD-10-CM | POA: Diagnosis not present

## 2024-04-01 DIAGNOSIS — Z961 Presence of intraocular lens: Secondary | ICD-10-CM | POA: Diagnosis not present

## 2024-06-06 ENCOUNTER — Ambulatory Visit (INDEPENDENT_AMBULATORY_CARE_PROVIDER_SITE_OTHER): Admitting: Nurse Practitioner

## 2024-06-06 ENCOUNTER — Encounter: Payer: Self-pay | Admitting: Nurse Practitioner

## 2024-06-06 VITALS — BP 133/82 | HR 88 | Temp 98.2°F | Ht 62.01 in | Wt 181.2 lb

## 2024-06-06 DIAGNOSIS — J0110 Acute frontal sinusitis, unspecified: Secondary | ICD-10-CM | POA: Diagnosis not present

## 2024-06-06 MED ORDER — AMOXICILLIN 500 MG PO CAPS: 500.0000 mg | ORAL_CAPSULE | Freq: Two times a day (BID) | ORAL | 0 refills | Status: DC

## 2024-06-06 NOTE — Progress Notes (Signed)
 BP 133/82 (BP Location: Left Arm, Cuff Size: Normal)   Pulse 88   Temp 98.2 F (36.8 C) (Oral)   Ht 5' 2.01 (1.575 m)   Wt 181 lb 3.2 oz (82.2 kg)   SpO2 96%   BMI 33.13 kg/m    Subjective:    Patient ID: Caitlyn Miranda, female    DOB: 1946/02/28, 78 y.o.   MRN: [id number]  HPI: Caitlyn Miranda is a 78 y.o. female  Chief Complaint  Patient presents with   Eye Drainage    Patient stated she feels like there is grit in her eye. Under her eyes are hurting and the bridge of her nose. It started Friday (patient stated). She used refresh eye drops as well and it did not help.   Headache   Patient states she thinks she has a sinus infection.  States her right eye is red and watery.  States it is throbbing down under her eye and around her eye.  She has a throbbing headache.  States symptoms started on late Friday afternoon.  She took some Tylenol  but it hasn't helped much.  Does have congestion and a little ear pain on the right side.  She recently had a URI with a cough.  Denies any current cough, sore throat, fever, tooth ache.      Relevant past medical, surgical, family and social history reviewed and updated as indicated. Interim medical history since our last visit reviewed. Allergies and medications reviewed and updated.  Review of Systems  Constitutional:  Negative for fever.  HENT:  Positive for congestion, ear pain, sinus pressure and sinus pain. Negative for sore throat.   Eyes:  Positive for pain.  Respiratory:  Negative for cough.   Neurological:  Positive for headaches.    Per HPI unless specifically indicated above     Objective:    BP 133/82 (BP Location: Left Arm, Cuff Size: Normal)   Pulse 88   Temp 98.2 F (36.8 C) (Oral)   Ht 5' 2.01 (1.575 m)   Wt 181 lb 3.2 oz (82.2 kg)   SpO2 96%   BMI 33.13 kg/m   Wt Readings from Last 3 Encounters:  06/06/24 181 lb 3.2 oz (82.2 kg)  11/12/23 180 lb (81.6 kg)  11/19/22 183 lb (83 kg)    Physical  Exam Vitals and nursing note reviewed.  Constitutional:      General: She is not in acute distress.    Appearance: Normal appearance. She is normal weight. She is not ill-appearing, toxic-appearing or diaphoretic.  HENT:     Head: Normocephalic.     Right Ear: Tympanic membrane and external ear normal.     Left Ear: Tympanic membrane and external ear normal.     Nose: Congestion and rhinorrhea present.     Right Sinus: Maxillary sinus tenderness and frontal sinus tenderness present.     Left Sinus: Maxillary sinus tenderness and frontal sinus tenderness present.     Mouth/Throat:     Mouth: Mucous membranes are moist.     Pharynx: Posterior oropharyngeal erythema present. No oropharyngeal exudate.  Eyes:     General:        Right eye: No discharge.        Left eye: No discharge.     Extraocular Movements: Extraocular movements intact.     Conjunctiva/sclera: Conjunctivae normal.     Pupils: Pupils are equal, round, and reactive to light.     Comments: Erythema of sclera on right side. +  1 edema of right upper and lower lid  Cardiovascular:     Rate and Rhythm: Normal rate and regular rhythm.     Heart sounds: No murmur heard. Pulmonary:     Effort: Pulmonary effort is normal. No respiratory distress.     Breath sounds: Normal breath sounds. No wheezing or rales.  Musculoskeletal:     Cervical back: Normal range of motion and neck supple.  Skin:    General: Skin is warm and dry.     Capillary Refill: Capillary refill takes less than 2 seconds.  Neurological:     General: No focal deficit present.     Mental Status: She is alert and oriented to person, place, and time. Mental status is at baseline.  Psychiatric:        Mood and Affect: Mood normal.        Behavior: Behavior normal.        Thought Content: Thought content normal.        Judgment: Judgment normal.     Results for orders placed or performed in visit on 11/19/22  Comp Met (CMET)   Collection Time: 11/19/22   1:47 PM  Result Value Ref Range   Glucose 99 70 - 99 mg/dL   BUN 11 8 - 27 mg/dL   Creatinine, Ser 9.18 0.57 - 1.00 mg/dL   eGFR 75 >40 fO/fpw/8.26   BUN/Creatinine Ratio 14 12 - 28   Sodium 140 134 - 144 mmol/L   Potassium 4.2 3.5 - 5.2 mmol/L   Chloride 101 96 - 106 mmol/L   CO2 23 20 - 29 mmol/L   Calcium 10.0 8.7 - 10.3 mg/dL   Total Protein 6.9 6.0 - 8.5 g/dL   Albumin 4.7 3.8 - 4.8 g/dL   Globulin, Total 2.2 1.5 - 4.5 g/dL   Albumin/Globulin Ratio 2.1 1.2 - 2.2   Bilirubin Total 0.5 0.0 - 1.2 mg/dL   Alkaline Phosphatase 77 44 - 121 IU/L   AST 18 0 - 40 IU/L   ALT 12 0 - 32 IU/L  Lipid Profile   Collection Time: 11/19/22  1:47 PM  Result Value Ref Range   Cholesterol, Total 241 (H) 100 - 199 mg/dL   Triglycerides 845 (H) 0 - 149 mg/dL   HDL 67 >60 mg/dL   VLDL Cholesterol Cal 27 5 - 40 mg/dL   LDL Chol Calc (NIH) 852 (H) 0 - 99 mg/dL   Chol/HDL Ratio 3.6 0.0 - 4.4 ratio  TSH   Collection Time: 11/19/22  1:47 PM  Result Value Ref Range   TSH 4.430 0.450 - 4.500 uIU/mL  T4, free   Collection Time: 11/19/22  1:47 PM  Result Value Ref Range   Free T4 1.07 0.82 - 1.77 ng/dL      Assessment & Plan:   Problem List Items Addressed This Visit   None Visit Diagnoses       Acute non-recurrent frontal sinusitis    -  Primary   Will treat with amoxicillin . Complete course of antibiotics.  Follow up if not improved.   Relevant Medications   amoxicillin  (AMOXIL ) 500 MG capsule        Follow up plan: No follow-ups on file.

## 2024-06-07 DIAGNOSIS — H1045 Other chronic allergic conjunctivitis: Secondary | ICD-10-CM | POA: Diagnosis not present

## 2024-06-07 DIAGNOSIS — H04123 Dry eye syndrome of bilateral lacrimal glands: Secondary | ICD-10-CM | POA: Diagnosis not present

## 2024-06-09 ENCOUNTER — Other Ambulatory Visit: Payer: Self-pay

## 2024-06-09 ENCOUNTER — Observation Stay
Admission: EM | Admit: 2024-06-09 | Discharge: 2024-06-13 | DRG: 125 | Disposition: A | Attending: Student | Admitting: Student

## 2024-06-09 ENCOUNTER — Encounter: Payer: Self-pay | Admitting: Emergency Medicine

## 2024-06-09 DIAGNOSIS — H209 Unspecified iridocyclitis: Secondary | ICD-10-CM | POA: Diagnosis not present

## 2024-06-09 DIAGNOSIS — H5789 Other specified disorders of eye and adnexa: Secondary | ICD-10-CM

## 2024-06-09 DIAGNOSIS — B0232 Zoster iridocyclitis: Secondary | ICD-10-CM | POA: Insufficient documentation

## 2024-06-09 DIAGNOSIS — E871 Hypo-osmolality and hyponatremia: Secondary | ICD-10-CM

## 2024-06-09 DIAGNOSIS — B0230 Zoster ocular disease, unspecified: Principal | ICD-10-CM

## 2024-06-09 DIAGNOSIS — R112 Nausea with vomiting, unspecified: Secondary | ICD-10-CM

## 2024-06-09 LAB — BASIC METABOLIC PANEL WITH GFR
Anion gap: 15 (ref 5–15)
BUN: 12 mg/dL (ref 8–23)
CO2: 20 mmol/L — ABNORMAL LOW (ref 22–32)
Calcium: 9.6 mg/dL (ref 8.9–10.3)
Chloride: 91 mmol/L — ABNORMAL LOW (ref 98–111)
Creatinine, Ser: 0.65 mg/dL (ref 0.44–1.00)
GFR, Estimated: >60 mL/min (ref >60)
Glucose, Bld: 116 mg/dL — ABNORMAL HIGH (ref 70–99)
Potassium: 3.5 mmol/L (ref 3.5–5.1)
Sodium: 126 mmol/L — ABNORMAL LOW (ref 135–145)

## 2024-06-09 LAB — CBC
HCT: 42.3 % (ref 36.0–46.0)
Hemoglobin: 14.6 g/dL (ref 12.0–15.0)
MCH: 28 pg (ref 26.0–34.0)
MCHC: 34.5 g/dL (ref 30.0–36.0)
MCV: 81 fL (ref 80.0–100.0)
Platelets: 305 K/uL (ref 150–400)
RBC: 5.22 MIL/uL — ABNORMAL HIGH (ref 3.87–5.11)
RDW: 12.8 % (ref 11.5–15.5)
WBC: 8.6 K/uL (ref 4.0–10.5)
nRBC: 0 % (ref 0.0–0.2)

## 2024-06-09 LAB — SODIUM: Sodium: 135 mmol/L (ref 135–145)

## 2024-06-09 MED ORDER — TETRACAINE HCL 0.5 % OP SOLN
1.0000 [drp] | Freq: Once | OPHTHALMIC | Status: AC
  Administered 2024-06-09: 1 [drp] via OPHTHALMIC
  Filled 2024-06-09: qty 4

## 2024-06-09 MED ORDER — HYDROCODONE-ACETAMINOPHEN 5-325 MG PO TABS
1.0000 | ORAL_TABLET | ORAL | Status: DC | PRN
  Administered 2024-06-11 – 2024-06-12 (×4): 1 via ORAL
  Filled 2024-06-09 (×4): qty 1

## 2024-06-09 MED ORDER — ONDANSETRON HCL 4 MG PO TABS: 4.0000 mg | ORAL_TABLET | Freq: Four times a day (QID) | ORAL | Status: DC | PRN

## 2024-06-09 MED ORDER — PREDNISOLONE ACETATE 1 % OP SUSP
1.0000 [drp] | Freq: Four times a day (QID) | OPHTHALMIC | Status: DC
  Administered 2024-06-09 – 2024-06-13 (×17): 1 [drp] via OPHTHALMIC
  Filled 2024-06-09: qty 5
  Filled 2024-06-09: qty 1

## 2024-06-09 MED ORDER — ACETAMINOPHEN 650 MG RE SUPP: 650.0000 mg | Freq: Four times a day (QID) | RECTAL | Status: DC | PRN

## 2024-06-09 MED ORDER — ONDANSETRON HCL 4 MG/2ML IJ SOLN
4.0000 mg | Freq: Once | INTRAMUSCULAR | Status: AC
  Administered 2024-06-09: 4 mg via INTRAVENOUS
  Filled 2024-06-09: qty 2

## 2024-06-09 MED ORDER — MECLIZINE HCL 25 MG PO TABS: 12.5000 mg | ORAL_TABLET | Freq: Three times a day (TID) | ORAL | Status: DC | PRN

## 2024-06-09 MED ORDER — DEXTROSE 5 % IV SOLN
10.0000 mg/kg | Freq: Three times a day (TID) | INTRAVENOUS | Status: DC
  Administered 2024-06-09 – 2024-06-11 (×6): 620 mg via INTRAVENOUS
  Filled 2024-06-09 (×8): qty 12.4

## 2024-06-09 MED ORDER — METOCLOPRAMIDE HCL 5 MG/ML IJ SOLN
10.0000 mg | Freq: Once | INTRAMUSCULAR | Status: AC
  Administered 2024-06-09: 10 mg via INTRAVENOUS
  Filled 2024-06-09: qty 2

## 2024-06-09 MED ORDER — ONDANSETRON HCL 4 MG/2ML IJ SOLN
4.0000 mg | Freq: Four times a day (QID) | INTRAMUSCULAR | Status: DC | PRN
  Administered 2024-06-10: 4 mg via INTRAVENOUS
  Filled 2024-06-09: qty 2

## 2024-06-09 MED ORDER — FLUORESCEIN SODIUM 1 MG OP STRP
1.0000 | ORAL_STRIP | Freq: Once | OPHTHALMIC | Status: AC
  Administered 2024-06-09: 1 via OPHTHALMIC
  Filled 2024-06-09: qty 1

## 2024-06-09 MED ORDER — SODIUM CHLORIDE 0.9 % IV BOLUS
1000.0000 mL | Freq: Once | INTRAVENOUS | Status: AC
  Administered 2024-06-09: 1000 mL via INTRAVENOUS

## 2024-06-09 MED ORDER — SODIUM CHLORIDE 0.9 % IV SOLN: INTRAVENOUS | Status: DC

## 2024-06-09 MED FILL — Acetaminophen Tab 325 MG: 650.0000 mg | ORAL | Qty: 2 | Status: AC

## 2024-06-09 NOTE — Consult Note (Addendum)
 Pharmacy Medication Note  ASSESSMENT: 78 y.o. female with PMH including HTN, obesity  is presenting with herpes zoster. She has an aching rash on her forehead and right upper face, along with increased tearing. She went to her eye doctor who prescribed steroid cream and Maxitrol eye drops, eventually adding valacyclovir for shingles. Patient has been unable to tolerate valacyclovir due to nausea & vomiting. Pharmacy has been consulted to manage intravenous acyclovir dosing.   PLAN: Initiate acyclovir 620mg  (10mg /kg adjBW) IV q8H Initiate sodium chloride 0.9% infusion at reduced rate of 50mL/hr to prevent crystal nephropathy (reduced rate per EDP request due to patient having already received liter bolus) Monitor renal function to assess for any necessary dosing changes.  Patient measurements: Height: 5' 2 (157.5 cm) Weight: 79.8 kg (176 lb) IBW/kg (Calculated) : 50.1  Vital signs: Temp: 98.2 F (36.8 C) (12/11 0917) Temp Source: Oral (12/11 0917) BP: 154/93 (12/11 0917) Pulse Rate: 88 (12/11 0917) Recent Labs  Lab 06/09/24 0916  WBC 8.6  CREATININE 0.65   Estimated Creatinine Clearance: 56.7 mL/min (by C-G formula based on SCr of 0.65 mg/dL).  Allergies: Allergies[1]  Dose adjustments this admission: n/a  Thank you for allowing pharmacy to be a part of this patients care.  Will M. Lenon, PharmD, BCPS Clinical Pharmacist 06/09/2024 10:22 AM     [1]  Allergies Allergen Reactions   Biaxin [Clarithromycin] Other (See Comments)    ulcers   Codeine    Iodinated Contrast Media Rash

## 2024-06-09 NOTE — ED Notes (Signed)
 PARAMEDIC BRANDY INFORMED OF BED ASSIGNED

## 2024-06-09 NOTE — ED Triage Notes (Signed)
 Pt via POV from home. Pt c/o headache for the past couple of days, PCP was started amoxicillin  for a possible sinus infection, pt states after which she started having nausea. Reports also started having a shingles outbreak to the R side of her face that her PCP also prescribed anti-virals but states she is unable to keep any of her medication down. Pt is A&Ox4 and NAD

## 2024-06-09 NOTE — ED Provider Notes (Signed)
 [facility] Provider Note    Event Date/Time   First MD Initiated Contact with Patient 06/09/24 [phone]     (approximate)   History   No chief complaint on file.   HPI  Caitlyn Miranda is a 78 y.o. female patient with history of GERD, presenting with nausea.  States that she was diagnosed with sinusitis as well as shingle.  Started feeling nauseous after starting amoxicillin  for sinusitis.  Unable to tolerate p.o. due to the nausea vomiting.  States that she went to see her doctor because she was noting aching to her right forehead and face.  Was told that she has sinusitis.  Went to see her eye doctor on Tuesday because she noticed a rash to her upper face and was having increased tearing.  States that he did an eye exam, told her to start triamcinolone  cream to her face and also polymyxin ophthalmicus.  She did state that he did a fluorescein stain on her eye and did not find any abnormalities.  States that he called her a day later and asked about her rash and was concerned that she might have shingles, started her on valacyclovir.  Patient states she has been unable to take the antivirals due to her nausea vomiting.  She denies any chest pain, no fever or urinary symptoms, no diarrhea.  States that her eye has been watery but after blinking, vision is clear, see that she has a headache but no actual eye pain, no pain with extraocular movements.  Does note some sensitivity to light on the right.  On independent chart review, she was seen in early December, was diagnosed with sinusitis and started on amoxicillin  500 mg.     Physical Exam   Triage Vital Signs: ED Triage Vitals  Encounter Vitals Group     BP 06/09/24 0917 (!) 154/93     Girls Systolic BP Percentile --      Girls Diastolic BP Percentile --      Boys Systolic BP Percentile --      Boys Diastolic BP Percentile --      Pulse Rate 06/09/24 0917 88     Resp 06/09/24 0917 18     Temp 06/09/24 0917  98.2 F (36.8 C)     Temp Source 06/09/24 0917 Oral     SpO2 06/09/24 0917 100 %     Weight 06/09/24 0913 176 lb (79.8 kg)     Height 06/09/24 0913 5' 2 (1.575 m)     Head Circumference --      Peak Flow --      Pain Score 06/09/24 0913 10     Pain Loc --      Pain Education --      Exclude from Growth Chart --     Most recent vital signs: Vitals:   06/09/24 0917 06/09/24 0942  BP: (!) 154/93   Pulse: 88   Resp: 18   Temp: 98.2 F (36.8 C)   SpO2: 100% 100%     General: Awake, no distress.  CV:  Good peripheral perfusion.  Resp:  Normal effort.  No tachypnea or respiratory distress Abd:  No distention.  Soft nontender Other:  She has a herpetic rash to the V1 distribution on the right, does extend to her eyelids, she does have right periorbital swelling, her pupils are equal and reactive, extraocular movements are intact without pain with movement, her conjunctival is injected on the right, there is no  purulent drainage.   ED Results / Procedures / Treatments   Labs (all labs ordered are listed, but only abnormal results are displayed) Labs Reviewed  CBC - Abnormal; Notable for the following components:      Result Value   RBC 5.22 (*)    All other components within normal limits  BASIC METABOLIC PANEL WITH GFR - Abnormal; Notable for the following components:   Sodium 126 (*)    Chloride 91 (*)    CO2 20 (*)    Glucose, Bld 116 (*)    All other components within normal limits     PROCEDURES:  Critical Care performed: Yes, see critical care procedure note(s)  .Critical Care  Performed by: Waymond Lorelle Cummins, MD Authorized by: Waymond Lorelle Cummins, MD   Critical care provider statement:    Critical care time (minutes):  40   Critical care was time spent personally by me on the following activities:  Development of treatment plan with patient or surrogate, discussions with consultants, evaluation of patient's response to treatment, examination of patient, ordering and  review of laboratory studies, ordering and review of radiographic studies, ordering and performing treatments and interventions, pulse oximetry, re-evaluation of patient's condition and review of old charts    MEDICATIONS ORDERED IN ED: Medications  acyclovir (ZOVIRAX) 620 mg in dextrose 5 % 100 mL IVPB (has no administration in time range)  0.9 %  sodium chloride infusion ( Intravenous New Bag/Given 06/09/24 1055)  ondansetron  (ZOFRAN ) injection 4 mg (has no administration in time range)  prednisoLONE acetate (PRED FORTE) 1 % ophthalmic suspension 1 drop (has no administration in time range)  sodium chloride 0.9 % bolus 1,000 mL (0 mLs Intravenous Stopped 06/09/24 1054)  metoCLOPramide (REGLAN) injection 10 mg (10 mg Intravenous Given 06/09/24 1021)  fluorescein ophthalmic strip 1 strip (1 strip Right Eye Given 06/09/24 1050)  tetracaine  (PONTOCAINE) 0.5 % ophthalmic solution 1 drop (1 drop Right Eye Given by Other 06/09/24 1050)     IMPRESSION / MDM / ASSESSMENT AND PLAN / ED COURSE  I reviewed the triage vital signs and the nursing notes.                              Differential diagnosis includes, but is not limited to, medication side effect, electrolyte derangements, herpes ophthalmicus.  Will repeat worsening seen to make sure that I do not see any obvious dendritic lesions, will give her some fluids here, get basic labs, patient is asking for something for headache, will give her IV Reglan.  Given her nausea vomiting, will give her a dose of IV acyclovir here.  Patient's presentation is most consistent with acute presentation with potential threat to life or bodily function.  Independent interpretation of labs and imaging below.  On reassessment patient still nauseous, will give her dose of Zofran .  Labs are notable for hyponatremia, no leukocytosis.  Fluorescein stain was done at bedside, did not reveal any uptake or dendritic lesions.  Will reach out to ophthalmology.  Will  likely plan to admit her given her hyponatremia, persistent nausea, herpes ophthalmicus requiring IV acyclovir.  Clinical course as below.  Given her persistent nausea and inability to tolerate p.o. at this time.  Will plan to have her admitted for IV antivirals and symptom control.  Ophthalmology says patient after discharge should follow-up with them in a couple days to get reassessed.  Consult the hospitalist for admission, she is admitted.  Clinical Course as of 06/09/24 1100  Thu Jun 09, 2024  1047 Consulted ophthalmology, given the herpetic rash and eye symptoms, overall picture concerning for herpetic iritis.  Recommended starting  prednisolone 1% drops 4 times a day in addition to antivirals. [TT]    Clinical Course User Index [TT] Waymond Lorelle Cummins, MD     FINAL CLINICAL IMPRESSION(S) / ED DIAGNOSES   Final diagnoses:  Herpes zoster ophthalmicus  Periorbital swelling  Nausea and vomiting, unspecified vomiting type  Hyponatremia     Rx / DC Orders   ED Discharge Orders     None        Note:  This document was prepared using Dragon voice recognition software and may include unintentional dictation errors.    Waymond Lorelle Cummins, MD 06/09/24 [phone]

## 2024-06-09 NOTE — H&P (Addendum)
 History and Physical    Caitlyn Miranda FMW:[id number] DOB: 12/21/1945 DOA: 06/09/2024  PCP: Valerio Melanie DASEN, NP (Confirm with patient/family/NH records and if not entered, this has to be entered at [facility] point of entry) Patient coming from: Home  I have personally briefly reviewed patient's old medical records in [facility]  Chief Complaint: Photosensitivity, right-sided facial rash and pain  HPI: Caitlyn Miranda is a 78 y.o. female with medical history significant of GERD, presented with worsening of right sided face rash and right eye oversensitivity to light.  Symptoms started last Friday, when patient started to have a right-sided forehead rash and pain, was diagnosed with facial cellulitis and started on amoxicillin .  Over the weekend patient started develop photosensitivity in the right eye, had to wear sunglasses to avoid irritant and tearing.  Went to see ophthalmologist on Monday, when she was diagnosed with bacterial conjunctivitis and started on antibiotics eyedrops.  No significant improvement over the last few days and she started to have skin break up with shingles since Tuesday, yesterday ophthalmologist started on Valtrex however patient developed significant nausea and has not been able to tolerate oral antiviral medications.  ED Course: Afebrile, nontachycardic blood pressure 150/90 O2 saturation 100% on room air.  ED physician did fluorescein study, which did not show any corneal ulcers.  Ophthalmology was consulted by ED physician who recommended IV antiviral medications until patient able to tolerate p.o. and prednisone eyedrop every 6 hours.  Review of Systems: As per HPI otherwise 14 point review of systems negative.    Past Medical History:  Diagnosis Date   Gallstones    GERD (gastroesophageal reflux disease)    Wears dentures    partial upper and lower    Past Surgical History:  Procedure Laterality Date   CATARACT EXTRACTION W/PHACO Right 08/14/2021    Procedure: CATARACT EXTRACTION PHACO AND INTRAOCULAR LENS PLACEMENT (IOC) RIGHT 28.18 02:41.5;  Surgeon: Mittie Gaskin, MD;  Location[facility];  Service: Ophthalmology;  Laterality: Right;   CATARACT EXTRACTION W/PHACO Left 08/28/2021   Procedure: CATARACT EXTRACTION PHACO AND INTRAOCULAR LENS PLACEMENT (IOC) LEFT 17.60 01:34.3;  Surgeon: Mittie Gaskin, MD;  Location: [facility];  Service: Ophthalmology;  Laterality: Left;   MASTECTOMY Right 1989     reports that she has never smoked. She has never used smokeless tobacco. She reports current alcohol use. She reports that she does not use drugs.  Allergies[1]  Family History  Problem Relation Age of Onset   Hypertension Mother    Hypertension Father    Heart disease Father    Diabetes Father    Lung disease Father    Cancer Sister        breast   Pancreatitis Maternal Grandmother    Hypertension Sister     Prior to Admission medications  Medication Sig Start Date End Date Taking? Authorizing Provider  amoxicillin  (AMOXIL ) 500 MG capsule Take 1 capsule (500 mg total) by mouth 2 (two) times daily for 10 days. 06/06/24 06/16/24  Melvin Pao, NP  methylPREDNISolone  (MEDROL  DOSEPAK) 4 MG TBPK tablet Follow directions on pack 11/12/23   Herold Hadassah SQUIBB, MD    Physical Exam: Vitals:   06/09/24 0913 06/09/24 0917 06/09/24 0942  BP:  (!) 154/93   Pulse:  88   Resp:  18   Temp:  98.2 F (36.8 C)   TempSrc:  Oral   SpO2:  100% 100%  Weight: 79.8 kg    Height: 5' 2 (1.575 m)  Constitutional: NAD, calm, comfortable Vitals:   06/09/24 0913 06/09/24 0917 06/09/24 0942  BP:  (!) 154/93   Pulse:  88   Resp:  18   Temp:  98.2 F (36.8 C)   TempSrc:  Oral   SpO2:  100% 100%  Weight: 79.8 kg    Height: 5' 2 (1.575 m)     Eyes: PERRL, lids and conjunctivae normal ENMT: Mucous membranes are dry. Posterior pharynx clear of any exudate or lesions.Normal dentition.  Neck: normal, supple, no  masses, no thyromegaly Respiratory: clear to auscultation bilaterally, no wheezing, no crackles. Normal respiratory effort. No accessory muscle use.  Cardiovascular: Regular rate and rhythm, no murmurs / rubs / gallops. No extremity edema. 2+ pedal pulses. No carotid bruits.  Abdomen: no tenderness, no masses palpated. No hepatosplenomegaly. Bowel sounds positive.  Musculoskeletal: no clubbing / cyanosis. No joint deformity upper and lower extremities. Good ROM, no contractures. Normal muscle tone.  Skin: Macular rash with vesicles on right side of forehead Neurologic: CN 2-12 grossly intact. Sensation intact, DTR normal. Strength 5/5 in all 4.  Psychiatric: Normal judgment and insight. Alert and oriented x 3. Normal mood.     Labs on Admission: I have personally reviewed following labs and imaging studies  CBC: Recent Labs  Lab 06/09/24 0916  WBC 8.6  HGB 14.6  HCT 42.3  MCV 81.0  PLT 305   Basic Metabolic Panel: Recent Labs  Lab 06/09/24 0916  NA 126*  K 3.5  CL 91*  CO2 20*  GLUCOSE 116*  BUN 12  CREATININE 0.65  CALCIUM 9.6   GFR: Estimated Creatinine Clearance: 56.7 mL/min (by C-G formula based on SCr of 0.65 mg/dL). Liver Function Tests: No results for input(s): AST, ALT, ALKPHOS, BILITOT, PROT, ALBUMIN in the last 168 hours. No results for input(s): LIPASE, AMYLASE in the last 168 hours. No results for input(s): AMMONIA in the last 168 hours. Coagulation Profile: No results for input(s): INR, PROTIME in the last 168 hours. Cardiac Enzymes: No results for input(s): CKTOTAL, CKMB, CKMBINDEX, TROPONINI in the last 168 hours. BNP (last 3 results) No results for input(s): PROBNP in the last 8760 hours. HbA1C: No results for input(s): HGBA1C in the last 72 hours. CBG: No results for input(s): GLUCAP in the last 168 hours. Lipid Profile: No results for input(s): CHOL, HDL, LDLCALC, TRIG, CHOLHDL, LDLDIRECT in the  last 72 hours. Thyroid  Function Tests: No results for input(s): TSH, T4TOTAL, FREET4, T3FREE, THYROIDAB in the last 72 hours. Anemia Panel: No results for input(s): VITAMINB12, FOLATE, FERRITIN, TIBC, IRON, RETICCTPCT in the last 72 hours. Urine analysis:    Component Value Date/Time   APPEARANCEUR Clear 01/27/2022 1026   GLUCOSEU Negative 01/27/2022 1026   BILIRUBINUR Negative 01/27/2022 1026   PROTEINUR Negative 01/27/2022 1026   NITRITE Negative 01/27/2022 1026   LEUKOCYTESUR 1+ (A) 01/27/2022 1026    Radiological Exams on Admission: No results found.  EKG: None  Assessment/Plan Principal Problem:   Iritis Active Problems:   Herpes zoster iritis of right eye  (please populate well all problems here in Problem List. (For example, if patient is on BP meds at home and you resume or decide to hold them, it is a problem that needs to be her. Same for CAD, COPD, HLD and so on)  Right eye herpes iritis - Failed outpatient management - As per recommendation from ophthalmology, will continue acyclovir IV until patient able to tolerate p.o. medications.  Prednisone eyedrops every 6 hours.  Outpatient follow-up  with ophthalmologist after discharge. -Airborne and contact precautions till lesions crusted - Symptomatic management, as needed Zofran , as needed pain medications.  Hyponatremia - Probably acute, appears to be volume contracted likely secondary to poor oral intake for 24 hours. - Agreed with short course of IV fluid and recheck sodium level this afternoon.    DVT prophylaxis: SCD Code Status: Full code Family Communication: Husband at bedside Disposition Plan: Expect less than 2 midnight hospital stay Consults called: Ophthalmologist curbside consult Admission status: MedSurg observation   Cort ONEIDA Mana MD [facility] Pager [phone]  06/09/2024, 11:27 AM        [1]  Allergies Allergen Reactions   Biaxin [Clarithromycin] Other (See  Comments)    ulcers   Codeine    Iodinated Contrast Media Rash

## 2024-06-10 DIAGNOSIS — H209 Unspecified iridocyclitis: Secondary | ICD-10-CM | POA: Diagnosis not present

## 2024-06-10 LAB — BASIC METABOLIC PANEL WITH GFR
Anion gap: 10 (ref 5–15)
BUN: 11 mg/dL (ref 8–23)
CO2: 25 mmol/L (ref 22–32)
Calcium: 8.8 mg/dL — ABNORMAL LOW (ref 8.9–10.3)
Chloride: 100 mmol/L (ref 98–111)
Creatinine, Ser: 0.75 mg/dL (ref 0.44–1.00)
GFR, Estimated: >60 mL/min (ref >60)
Glucose, Bld: 100 mg/dL — ABNORMAL HIGH (ref 70–99)
Potassium: 3.3 mmol/L — ABNORMAL LOW (ref 3.5–5.1)
Sodium: 134 mmol/L — ABNORMAL LOW (ref 135–145)

## 2024-06-10 LAB — VITAMIN D 25 HYDROXY (VIT D DEFICIENCY, FRACTURES): Vit D, 25-Hydroxy: 23.73 ng/mL — ABNORMAL LOW (ref 30–100)

## 2024-06-10 LAB — OSMOLALITY: Osmolality: 280 mosm/kg (ref 275–295)

## 2024-06-10 LAB — VITAMIN B12: Vitamin B-12: 219 pg/mL (ref 180–914)

## 2024-06-10 LAB — MAGNESIUM: Magnesium: 1.9 mg/dL (ref 1.7–2.4)

## 2024-06-10 LAB — PHOSPHORUS: Phosphorus: 3 mg/dL (ref 2.5–4.6)

## 2024-06-10 MED ORDER — ENSURE PLUS HIGH PROTEIN PO LIQD
237.0000 mL | Freq: Two times a day (BID) | ORAL | Status: DC
  Administered 2024-06-10 (×2): 237 mL via ORAL

## 2024-06-10 MED ORDER — ENOXAPARIN SODIUM 40 MG/0.4ML IJ SOSY
40.0000 mg | PREFILLED_SYRINGE | INTRAMUSCULAR | Status: DC
  Administered 2024-06-10: 40 mg via SUBCUTANEOUS
  Filled 2024-06-10: qty 0.4

## 2024-06-10 MED ORDER — POTASSIUM CHLORIDE 20 MEQ PO PACK
40.0000 meq | PACK | Freq: Once | ORAL | Status: AC
  Administered 2024-06-10: 40 meq via ORAL
  Filled 2024-06-10: qty 2

## 2024-06-10 MED ORDER — PANTOPRAZOLE SODIUM 40 MG PO TBEC
40.0000 mg | DELAYED_RELEASE_TABLET | Freq: Every day | ORAL | Status: DC
  Administered 2024-06-10 – 2024-06-13 (×4): 40 mg via ORAL
  Filled 2024-06-10 (×4): qty 1

## 2024-06-10 MED ORDER — ALUM & MAG HYDROXIDE-SIMETH 200-200-20 MG/5ML PO SUSP: 15.0000 mL | Freq: Four times a day (QID) | ORAL | Status: DC | PRN

## 2024-06-10 MED ORDER — SODIUM CHLORIDE 0.9 % IV SOLN: INTRAVENOUS | Status: DC

## 2024-06-10 MED ADMIN — Acetaminophen Tab 325 MG: 650 mg | ORAL | NDC 00904677361

## 2024-06-10 NOTE — Care Management Obs Status (Signed)
 MEDICARE OBSERVATION STATUS NOTIFICATION   Patient Details  Name: Caitlyn Miranda MRN: [id number] Date of Birth: 1946/01/24   Medicare Observation Status Notification Given:  Chaney BRANDY CHRISTIANE LELON, CMA 06/10/2024, 11:28 AM

## 2024-06-10 NOTE — Plan of Care (Signed)
   Problem: Education: Goal: Knowledge of General Education information will improve Description Including pain rating scale, medication(s)/side effects and non-pharmacologic comfort measures Outcome: Progressing   Problem: Health Behavior/Discharge Planning: Goal: Ability to manage health-related needs will improve Outcome: Progressing

## 2024-06-10 NOTE — Progress Notes (Signed)
 [facility] Progress Note  Patient: Caitlyn Miranda    FMW:[id number]  DOA: 06/09/2024     Date of Service: the patient was seen and examined on 06/10/2024  No chief complaint on file.  Brief hospital course: Tiya Schrupp is a 78 y.o. female with medical history significant of GERD, presented with worsening of right sided face rash and right eye oversensitivity to light.   Symptoms started last Friday, when patient started to have a right-sided forehead rash and pain, was diagnosed with facial cellulitis and started on amoxicillin .  Over the weekend patient started develop photosensitivity in the right eye, had to wear sunglasses to avoid irritant and tearing.  Went to see ophthalmologist on Monday, when she was diagnosed with bacterial conjunctivitis and started on antibiotics eyedrops.  No significant improvement over the last few days and she started to have skin break up with shingles since Tuesday, yesterday ophthalmologist started on Valtrex however patient developed significant nausea and has not been able to tolerate oral antiviral medications.   ED Course: Afebrile, nontachycardic blood pressure 150/90 O2 saturation 100% on room air.  ED physician did fluorescein study, which did not show any corneal ulcers.  Ophthalmology was consulted by ED physician who recommended IV antiviral medications until patient able to tolerate p.o. and prednisone eyedrop every 6 hours.     Assessment and Plan:  # Right eye herpes iritis - Failed outpatient management - As per recommendation from ophthalmology, will continue acyclovir IV until patient able to tolerate p.o. medications.  Prednisone eyedrops every 6 hours.  Outpatient follow-up with ophthalmologist after discharge. -Airborne and contact precautions till lesions crusted - Symptomatic management, as needed Zofran , as needed pain medications.   # Hyponatremia Na 126>>>134 S/p IVF, sodium level is improving Continue to monitor  chemistry daily  # Hypokalemia, potassium repleted. Monitor electrolytes daily    Body mass index is 31.57 kg/m.  Interventions:  Diet: Regular diet DVT Prophylaxis: Subcutaneous Lovenox   Advance goals of care discussion: Full code  Family Communication: family was not present at bedside, at the time of interview.  The pt provided permission to discuss medical plan with the family. Opportunity was given to ask question and all questions were answered satisfactorily.   Disposition:  Pt is from home, admitted with right eye herpes iritis, still has significant pain and on IV acyclovir, which precludes a safe discharge. Discharge to home, when stable, most likely tomorrow a.m.  Subjective: No significant events overnight, patient was complaining of some headache in the morning, eye symptoms are getting better, she used last night prednisone eyedrops and she was seeing halos of colors which resolved.   Physical Exam: General: NAD, lying comfortably Appear in no distress, affect appropriate Eyes: PERRLA, herpes rash over the right eyelid and right forehead, no new blisters noticed ENT: Oral Mucosa Clear, moist  Neck: no JVD,  Cardiovascular: S1 and S2 Present, no Murmur,  Respiratory: good respiratory effort, Bilateral Air entry equal and Decreased, no Crackles, no wheezes Abdomen: Bowel Sound present, Soft and no tenderness,  Skin: no rashes Extremities: no Pedal edema, no calf tenderness Neurologic: without any new focal findings Gait not checked due to patient safety concerns  Vitals:   06/09/24 1923 06/09/24 1941 06/10/24 0540 06/10/24 0806  BP: (!) 120/102 (!) 164/87 (!) 114/53 124/64  Pulse: 63 85 93 77  Resp: 17 17 17 16   Temp: 98.7 F (37.1 C) 98.5 F (36.9 C) 99.5 F (37.5 C) 98.5 F (36.9 C)  TempSrc:  Oral Oral Oral   SpO2: 97% 99% 96% 96%  Weight:  78.3 kg    Height:  5' 2 (1.575 m)     No intake or output data in the 24 hours ending 06/10/24 1502 Filed  Weights   06/09/24 0913 06/09/24 1941  Weight: 79.8 kg 78.3 kg    Data Reviewed: I have personally reviewed and interpreted daily labs, tele strips, imagings as discussed above. I reviewed all nursing notes, pharmacy notes, vitals, pertinent old records I have discussed plan of care as described above with RN and patient/family.  CBC: Recent Labs  Lab 06/09/24 0916  WBC 8.6  HGB 14.6  HCT 42.3  MCV 81.0  PLT 305   Basic Metabolic Panel: Recent Labs  Lab 06/09/24 0916 06/09/24 2148 06/10/24 0614  NA 126* 135 134*  K 3.5  --  3.3*  CL 91*  --  100  CO2 20*  --  25  GLUCOSE 116*  --  100*  BUN 12  --  11  CREATININE 0.65  --  0.75  CALCIUM 9.6  --  8.8*  MG  --   --  1.9  PHOS  --   --  3.0    Studies: No results found.  Scheduled Meds:  enoxaparin (LOVENOX) injection  40 mg Subcutaneous Q24H   feeding supplement  237 mL Oral BID BM   prednisoLONE acetate  1 drop Right Eye QID   Continuous Infusions:  sodium chloride 50 mL/hr at 06/10/24 1000   acyclovir Stopped (06/10/24 1241)   PRN Meds: acetaminophen  **OR** acetaminophen , HYDROcodone-acetaminophen , meclizine, ondansetron  **OR** ondansetron  (ZOFRAN ) IV  Time spent: 35 minutes  Author: ELVAN SOR. MD [facility] 06/10/2024 3:02 PM  To reach On-call, see care teams to locate the attending and reach out to them via [email or web address]. If 7PM-7AM, please contact night-coverage If you still have difficulty reaching the attending provider, please page the [facility] (Director on Call) for [facility] on amion for assistance.

## 2024-06-10 NOTE — Plan of Care (Signed)

## 2024-06-10 NOTE — Plan of Care (Signed)

## 2024-06-11 ENCOUNTER — Observation Stay

## 2024-06-11 DIAGNOSIS — Z9011 Acquired absence of right breast and nipple: Secondary | ICD-10-CM | POA: Diagnosis not present

## 2024-06-11 DIAGNOSIS — Z961 Presence of intraocular lens: Secondary | ICD-10-CM | POA: Diagnosis present

## 2024-06-11 DIAGNOSIS — B0230 Zoster ocular disease, unspecified: Secondary | ICD-10-CM | POA: Diagnosis present

## 2024-06-11 DIAGNOSIS — Z885 Allergy status to narcotic agent status: Secondary | ICD-10-CM | POA: Diagnosis not present

## 2024-06-11 DIAGNOSIS — E876 Hypokalemia: Secondary | ICD-10-CM | POA: Diagnosis present

## 2024-06-11 DIAGNOSIS — B0051 Herpesviral iridocyclitis: Secondary | ICD-10-CM | POA: Diagnosis present

## 2024-06-11 DIAGNOSIS — N179 Acute kidney failure, unspecified: Secondary | ICD-10-CM | POA: Diagnosis not present

## 2024-06-11 DIAGNOSIS — Z833 Family history of diabetes mellitus: Secondary | ICD-10-CM | POA: Diagnosis not present

## 2024-06-11 DIAGNOSIS — Z91041 Radiographic dye allergy status: Secondary | ICD-10-CM | POA: Diagnosis not present

## 2024-06-11 DIAGNOSIS — H209 Unspecified iridocyclitis: Secondary | ICD-10-CM | POA: Diagnosis not present

## 2024-06-11 DIAGNOSIS — K219 Gastro-esophageal reflux disease without esophagitis: Secondary | ICD-10-CM | POA: Diagnosis present

## 2024-06-11 DIAGNOSIS — E559 Vitamin D deficiency, unspecified: Secondary | ICD-10-CM | POA: Diagnosis present

## 2024-06-11 DIAGNOSIS — Z79899 Other long term (current) drug therapy: Secondary | ICD-10-CM | POA: Diagnosis not present

## 2024-06-11 DIAGNOSIS — E871 Hypo-osmolality and hyponatremia: Secondary | ICD-10-CM | POA: Diagnosis present

## 2024-06-11 DIAGNOSIS — T375X5A Adverse effect of antiviral drugs, initial encounter: Secondary | ICD-10-CM | POA: Diagnosis not present

## 2024-06-11 DIAGNOSIS — Z8249 Family history of ischemic heart disease and other diseases of the circulatory system: Secondary | ICD-10-CM | POA: Diagnosis not present

## 2024-06-11 DIAGNOSIS — Z881 Allergy status to other antibiotic agents status: Secondary | ICD-10-CM | POA: Diagnosis not present

## 2024-06-11 LAB — CBC
HCT: 36.6 % (ref 36.0–46.0)
Hemoglobin: 11.9 g/dL — ABNORMAL LOW (ref 12.0–15.0)
MCH: 28 pg (ref 26.0–34.0)
MCHC: 32.5 g/dL (ref 30.0–36.0)
MCV: 86.1 fL (ref 80.0–100.0)
Platelets: 221 K/uL (ref 150–400)
RBC: 4.25 MIL/uL (ref 3.87–5.11)
RDW: 13.5 % (ref 11.5–15.5)
WBC: 7.3 K/uL (ref 4.0–10.5)
nRBC: 0 % (ref 0.0–0.2)

## 2024-06-11 LAB — MAGNESIUM: Magnesium: 2 mg/dL (ref 1.7–2.4)

## 2024-06-11 LAB — BASIC METABOLIC PANEL WITH GFR
Anion gap: 10 (ref 5–15)
Anion gap: 11 (ref 5–15)
BUN: 22 mg/dL (ref 8–23)
BUN: 23 mg/dL (ref 8–23)
CO2: 22 mmol/L (ref 22–32)
CO2: 22 mmol/L (ref 22–32)
Calcium: 8.7 mg/dL — ABNORMAL LOW (ref 8.9–10.3)
Calcium: 8.6 mg/dL — ABNORMAL LOW (ref 8.9–10.3)
Chloride: 102 mmol/L (ref 98–111)
Chloride: 102 mmol/L (ref 98–111)
Creatinine, Ser: 2.21 mg/dL — ABNORMAL HIGH (ref 0.44–1.00)
Creatinine, Ser: 2.43 mg/dL — ABNORMAL HIGH (ref 0.44–1.00)
GFR, Estimated: 22 mL/min — ABNORMAL LOW (ref >60)
GFR, Estimated: 20 mL/min — ABNORMAL LOW (ref >60)
Glucose, Bld: 107 mg/dL — ABNORMAL HIGH (ref 70–99)
Glucose, Bld: 92 mg/dL (ref 70–99)
Potassium: 3.6 mmol/L (ref 3.5–5.1)
Potassium: 3.5 mmol/L (ref 3.5–5.1)
Sodium: 134 mmol/L — ABNORMAL LOW (ref 135–145)
Sodium: 135 mmol/L (ref 135–145)

## 2024-06-11 LAB — PHOSPHORUS: Phosphorus: 4 mg/dL (ref 2.5–4.6)

## 2024-06-11 MED ORDER — VITAMIN D (ERGOCALCIFEROL) 1.25 MG (50000 UNIT) PO CAPS
50000.0000 [IU] | ORAL_CAPSULE | ORAL | Status: DC
  Administered 2024-06-11: 50000 [IU] via ORAL
  Filled 2024-06-11: qty 1

## 2024-06-11 MED ORDER — GABAPENTIN 100 MG PO CAPS
100.0000 mg | ORAL_CAPSULE | Freq: Three times a day (TID) | ORAL | Status: DC
  Administered 2024-06-11 – 2024-06-13 (×6): 100 mg via ORAL
  Filled 2024-06-11 (×6): qty 1

## 2024-06-11 MED ORDER — DEXTROSE 5 % IV SOLN: 10.0000 mg/kg | INTRAVENOUS | Status: DC

## 2024-06-11 MED ORDER — VALACYCLOVIR HCL 500 MG PO TABS: 500.0000 mg | ORAL_TABLET | Freq: Every day | ORAL | Status: DC

## 2024-06-11 MED ORDER — CYANOCOBALAMIN 1000 MCG/ML IJ SOLN
1000.0000 ug | Freq: Every day | INTRAMUSCULAR | Status: AC
  Administered 2024-06-11 – 2024-06-13 (×3): 1000 ug via INTRAMUSCULAR
  Filled 2024-06-11 (×3): qty 1

## 2024-06-11 MED ORDER — VITAMIN B-12 1000 MCG PO TABS: 1000.0000 ug | ORAL_TABLET | Freq: Every day | ORAL | Status: DC

## 2024-06-11 MED ORDER — VALACYCLOVIR HCL 500 MG PO TABS: 1000.0000 mg | ORAL_TABLET | Freq: Every day | ORAL | Status: DC

## 2024-06-11 MED ORDER — ENOXAPARIN SODIUM 30 MG/0.3ML IJ SOSY
30.0000 mg | PREFILLED_SYRINGE | INTRAMUSCULAR | Status: DC
  Filled 2024-06-11 (×2): qty 0.3

## 2024-06-11 MED ORDER — VALACYCLOVIR HCL 500 MG PO TABS
1000.0000 mg | ORAL_TABLET | Freq: Every day | ORAL | Status: DC
  Administered 2024-06-12 – 2024-06-13 (×2): 1000 mg via ORAL
  Filled 2024-06-11 (×2): qty 2

## 2024-06-11 MED ADMIN — Acetaminophen Tab 325 MG: 650 mg | ORAL | NDC 00904677361

## 2024-06-11 NOTE — Progress Notes (Addendum)
 [facility] Progress Note  Patient: Caitlyn Miranda    FMW:[id number]  DOA: 06/09/2024     Date of Service: the patient was seen and examined on 06/11/2024  No chief complaint on file.  Brief hospital course: Caitlyn Miranda is a 78 y.o. female with medical history significant of GERD, presented with worsening of right sided face rash and right eye oversensitivity to light.   Symptoms started last Friday, when patient started to have a right-sided forehead rash and pain, was diagnosed with facial cellulitis and started on amoxicillin .  Over the weekend patient started develop photosensitivity in the right eye, had to wear sunglasses to avoid irritant and tearing.  Went to see ophthalmologist on Monday, when she was diagnosed with bacterial conjunctivitis and started on antibiotics eyedrops.  No significant improvement over the last few days and she started to have skin break up with shingles since Tuesday, yesterday ophthalmologist started on Valtrex  however patient developed significant nausea and has not been able to tolerate oral antiviral medications.   ED Course: Afebrile, nontachycardic blood pressure 150/90 O2 saturation 100% on room air.  ED physician did fluorescein  study, which did not show any corneal ulcers.  Ophthalmology was consulted by ED physician who recommended IV antiviral medications until patient able to tolerate p.o. and prednisone eyedrop every 6 hours.     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  Started Valtrex  Prednisone eyedrops every 6 hours.  Outpatient follow-up with ophthalmologist after discharge. -Airborne and contact precautions till lesions crusted - Symptomatic management, as needed Zofran , as needed pain medications. 12/13 started gabapentin  100  mg p.o. 3 times daily for neuropathic pain    # AKI most likely secondary to acyclovir  12/13 discontinued extraocular sCr 0.75>>2.21>>2.43 Bladder scan rule out urinary retention US  Renal: negative for hydronephrosis, right renal lesion, recommended CT versus MRI as an outpatient. Continue IV fluid for hydration Monitor renal function and urine output Daily   # Hyponatremia: Resolved Na 126>>>134>135  S/p IVF, sodium level is improving Continue to monitor chemistry daily  # Hypokalemia, potassium repleted.  Resolved Monitor electrolytes daily  # Vitamin D  insufficiency: started vitamin D  50,000 units p.o. weekly, follow with PCP to repeat vitamin D  level after 3 to 6 months.  # Vitamin B12 level 219, goal >400: Started vitamin B12 1000 mcg IM injection daily during hospital stay, followed by oral supplement.  Follow-up PCP to repeat vitamin B12 level after 3 to 6 months.    Body mass index is 31.57 kg/m.  Interventions:  Diet: Regular diet DVT Prophylaxis: Subcutaneous Lovenox    Advance goals of care discussion: Full code  Family Communication: family was not present at bedside, at the time of interview.  The pt provided permission to discuss medical plan with the family. Opportunity was given to ask question and all questions were answered satisfactorily.   Disposition:  Pt is from home, admitted with right eye herpes iritis, still has significant pain and on IV acyclovir , which precludes a safe discharge. Discharge to home, when stable, most likely tomorrow a.m.  Subjective: No significant events overnight, patient still feels pain and lesions of the skin, blisters that they do not hurt and also having headache.  Denies any nausea vomiting, tolerating p.o. diet well.   Physical Exam: General: NAD, lying comfortably Appear in  no distress, affect appropriate Eyes: PERRLA, herpes rash over the right eyelid and right forehead, no new blisters noticed ENT: Oral Mucosa  Clear, moist  Neck: no JVD,  Cardiovascular: S1 and S2 Present, no Murmur,  Respiratory: good respiratory effort, Bilateral Air entry equal and Decreased, no Crackles, no wheezes Abdomen: Bowel Sound present, Soft and no tenderness,  Skin: no rashes Extremities: no Pedal edema, no calf tenderness Neurologic: without any new focal findings Gait not checked due to patient safety concerns  Vitals:   06/10/24 0806 06/10/24 1945 06/11/24 0401 06/11/24 0837  BP: 124/64 (!) 134/59 (!) 148/74 (!) 151/73  Pulse: 77 81 84 84  Resp: 16 18 18 19   Temp: 98.5 F (36.9 C) 97.8 F (36.6 C) 98.7 F (37.1 C) 98 F (36.7 C)  TempSrc:      SpO2: 96% 96% 96% 95%  Weight:      Height:        Intake/Output Summary (Last 24 hours) at 06/11/2024 1428 Last data filed at 06/10/2024 1500 Gross per 24 hour  Intake 117.74 ml  Output --  Net 117.74 ml   Filed Weights   06/09/24 0913 06/09/24 1941  Weight: 79.8 kg 78.3 kg    Data Reviewed: I have personally reviewed and interpreted daily labs, tele strips, imagings as discussed above. I reviewed all nursing notes, pharmacy notes, vitals, pertinent old records I have discussed plan of care as described above with RN and patient/family.  CBC: Recent Labs  Lab 06/09/24 0916 06/11/24 0506  WBC 8.6 7.3  HGB 14.6 11.9*  HCT 42.3 36.6  MCV 81.0 86.1  PLT 305 221   Basic Metabolic Panel: Recent Labs  Lab 06/09/24 0916 06/09/24 2148 06/10/24 0614 06/11/24 0506 06/11/24 1309  NA 126* 135 134* 134* 135  K 3.5  --  3.3* 3.6 3.5  CL 91*  --  100 102 102  CO2 20*  --  25 22 22   GLUCOSE 116*  --  100* 107* 92  BUN 12  --  11 22 23   CREATININE 0.65  --  0.75 2.21* 2.43*  CALCIUM 9.6  --  8.8* 8.7* 8.6*  MG  --   --  1.9 2.0  --   PHOS  --   --  3.0 4.0  --     Studies: US  RENAL Result Date: 06/11/2024 CLINICAL DATA:  [id number] AKI (acute kidney injury) [id number] EXAM: RENAL / URINARY TRACT ULTRASOUND COMPLETE COMPARISON:  October 14, 2015  FINDINGS: Right Kidney: Renal measurements: 11.0 x 4.9 x 3.3 cm = volume: 93 mL. Echogenicity within normal limits. No hydronephrosis visualized. Exophytic lobular contour along the interpolar to inferior kidney spanning approximately 2.3 x 2.1 cm (image 12 through 15, image 8) Left Kidney: Renal measurements: 11.9 x 5.1 x 4.8 = volume: 152 mL. Echogenicity within normal limits. No mass or hydronephrosis visualized. Bladder: Appears normal for degree of bladder distention. Other: None. IMPRESSION: 1. No hydronephrosis. 2. There is a lobular exophytic contour along the interpolar to inferior right kidney which may reflect benign renal tissue versus an underlying lesion. When clinically appropriate, recommend further evaluation with dedicated renal protocol CT or MRI with and without contrast to exclude underlying mass. Electronically Signed   By: Corean Salter M.D.   On: 06/11/2024 13:32    Scheduled Meds:  cyanocobalamin   1,000 mcg Intramuscular Q1200   Followed by   NOREEN ON 06/14/2024] vitamin B-12  1,000 mcg Oral Daily   enoxaparin  (LOVENOX ) injection  30  mg Subcutaneous Q24H   feeding supplement  237 mL Oral BID BM   pantoprazole   40 mg Oral Daily   prednisoLONE  acetate  1 drop Right Eye QID   [START ON 06/12/2024] valACYclovir   1,000 mg Oral Daily   Vitamin D  (Ergocalciferol )  50,000 Units Oral Q7 days   Continuous Infusions:  sodium chloride  75 mL/hr at 06/11/24 1123   PRN Meds: acetaminophen  **OR** acetaminophen , alum & mag hydroxide-simeth, HYDROcodone -acetaminophen , meclizine , ondansetron  **OR** ondansetron  (ZOFRAN ) IV  Time spent: 55 minutes  Author: ELVAN SOR. MD [facility] 06/11/2024 2:28 PM  To reach On-call, see care teams to locate the attending and reach out to them via [email or web address]. If 7PM-7AM, please contact night-coverage If you still have difficulty reaching the attending provider, please page the [facility] (Director on Call) for [facility] on amion  for assistance.

## 2024-06-11 NOTE — Plan of Care (Signed)

## 2024-06-11 NOTE — Progress Notes (Addendum)
 PHARMACIST - PHYSICIAN COMMUNICATION  CONCERNING:  Enoxaparin  (Lovenox ) for DVT Prophylaxis    RECOMMENDATION: Patient was prescribed enoxaprin 40mg  q24 hours for VTE prophylaxis.   Filed Weights   06/09/24 0913 06/09/24 1941  Weight: 79.8 kg (176 lb) 78.3 kg (172 lb 9.9 oz)    Body mass index is 31.57 kg/m.  Estimated Creatinine Clearance: 20.3 mL/min (A) (by C-G formula based on SCr of 2.21 mg/dL (H)).    Patient is candidate for enoxaparin  30mg  every 24 hours based on CrCl <47ml/min or Weight <45kg  DESCRIPTION: Pharmacy has adjusted enoxaparin  dose per [facility] policy.  Patient is now receiving enoxaparin  30 mg every 24 hours.     Annabella LOISE Banks, PharmD Clinical Pharmacist  06/11/2024 12:25 PM

## 2024-06-12 LAB — BASIC METABOLIC PANEL WITH GFR
Anion gap: 13 (ref 5–15)
BUN: 20 mg/dL (ref 8–23)
CO2: 22 mmol/L (ref 22–32)
Calcium: 8.6 mg/dL — ABNORMAL LOW (ref 8.9–10.3)
Chloride: 106 mmol/L (ref 98–111)
Creatinine, Ser: 2.16 mg/dL — ABNORMAL HIGH (ref 0.44–1.00)
GFR, Estimated: 23 mL/min — ABNORMAL LOW (ref >60)
Glucose, Bld: 91 mg/dL (ref 70–99)
Potassium: 3.8 mmol/L (ref 3.5–5.1)
Sodium: 141 mmol/L (ref 135–145)

## 2024-06-12 NOTE — Plan of Care (Signed)

## 2024-06-12 NOTE — Progress Notes (Signed)
 PROGRESS NOTE    Caitlyn Miranda  FMW:[id number] DOB: 05-08-1946 DOA: 06/09/2024 PCP: Valerio Melanie DASEN, NP    Brief Narrative:   Caitlyn Miranda is a 78 y.o. female with medical history significant of GERD, presented with worsening of right sided face rash and right eye oversensitivity to light.   Symptoms started last Friday, when patient started to have a right-sided forehead rash and pain, was diagnosed with facial cellulitis and started on amoxicillin .  Over the weekend patient started develop photosensitivity in the right eye, had to wear sunglasses to avoid irritant and tearing.  Went to see ophthalmologist on Monday, when she was diagnosed with bacterial conjunctivitis and started on antibiotics eyedrops.  No significant improvement over the last few days and she started to have skin break up with shingles since Tuesday, yesterday ophthalmologist started on Valtrex  however patient developed significant nausea and has not been able to tolerate oral antiviral medications.   ED Course: Afebrile, nontachycardic blood pressure 150/90 O2 saturation 100% on room air.  ED physician did fluorescein  study, which did not show any corneal ulcers.  Ophthalmology was consulted by ED physician who recommended IV antiviral medications until patient able to tolerate p.o. and prednisone eyedrop every 6 hours.     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    #  Hyponatremia: Resolved Na 126>>>134>135  S/p IVF, sodium level is improving   # Hypokalemia, potassium repleted.  Resolved Monitor electrolytes daily   # Vitamin D  insufficiency: started vitamin D  50,000 units p.o. weekly, follow with PCP to repeat vitamin D  level after 3 to 6 months.   # Vitamin B12 level 219, goal >400: Started vitamin B12 1000 mcg IM injection daily during hospital stay, followed by oral supplement.  Follow-up PCP to repeat vitamin B12 level after 3 to 6 months.  DVT prophylaxis: SQL Code Status: Full Family Communication: None Disposition Plan: Status is: Inpatient Remains inpatient appropriate because: Zoster iritis, AKI    Level of care: Med-Surg  Consultants:  None  Procedures:  None  Antimicrobials: Valtrex    Subjective: Seen and examined.  Resting in bed.  Endorses headache.  Objective: Vitals:   06/11/24 1654 06/11/24 2011 06/12/24 0300 06/12/24 0817  BP: (!) 179/76 (!) 148/69 (!) 140/72 (!) 140/64  Pulse: 75 71 68 67  Resp: 19 18 18 16   Temp: 97.9 F (36.6 C) 98.4 F (36.9 C) 97.9 F (36.6 C) 98.6 F (37 C)  TempSrc:      SpO2: 99% 97% 99% 96%  Weight:      Height:        Intake/Output Summary (Last 24 hours) at 06/12/2024 1506 Last data filed at 06/11/2024 1950 Gross per 24 hour  Intake 200 ml  Output --  Net 200 ml   Filed Weights   06/09/24 0913 06/09/24 1941  Weight: 79.8 kg 78.3 kg    Examination:  General exam: Appears calm and comfortable  Respiratory system: Clear to auscultation. Respiratory effort normal. Cardiovascular system: S1-S2, RRR,  no murmurs, no pedal edema Gastrointestinal system: Soft, NT/ND, normal bowel sounds Central nervous system: Alert and oriented. No focal neurological deficits. Extremities: Periocular oral lesions over right eye Skin: No rashes, lesions or ulcers Psychiatry: Judgement and insight appear normal. Mood & affect appropriate.     Data Reviewed: I have personally reviewed  following labs and imaging studies  CBC: Recent Labs  Lab 06/09/24 0916 06/11/24 0506  WBC 8.6 7.3  HGB 14.6 11.9*  HCT 42.3 36.6  MCV 81.0 86.1  PLT 305 221   Basic Metabolic Panel: Recent Labs  Lab 06/09/24 0916 06/09/24 2148 06/10/24 0614 06/11/24 0506 06/11/24 1309 06/12/24 0523  NA 126* 135 134* 134* 135 141  K 3.5  --  3.3* 3.6 3.5 3.8  CL 91*  --  100 102 102 106  CO2 20*  --  25 22 22 22   GLUCOSE 116*  --  100* 107* 92 91  BUN 12  --  11 22 23 20   CREATININE 0.65  --  0.75 2.21* 2.43* 2.16*  CALCIUM 9.6  --  8.8* 8.7* 8.6* 8.6*  MG  --   --  1.9 2.0  --   --   PHOS  --   --  3.0 4.0  --   --    GFR: Estimated Creatinine Clearance: 20.8 mL/min (A) (by C-G formula based on SCr of 2.16 mg/dL (H)). Liver Function Tests: No results for input(s): AST, ALT, ALKPHOS, BILITOT, PROT, ALBUMIN in the last 168 hours. No results for input(s): LIPASE, AMYLASE in the last 168 hours. No results for input(s): AMMONIA in the last 168 hours. Coagulation Profile: No results for input(s): INR, PROTIME in the last 168 hours. Cardiac Enzymes: No results for input(s): CKTOTAL, CKMB, CKMBINDEX, TROPONINI in the last 168 hours. BNP (last 3 results) No results for input(s): PROBNP in the last 8760 hours. HbA1C: No results for input(s): HGBA1C in the last 72 hours. CBG: No results for input(s): GLUCAP in the last 168 hours. Lipid Profile: No results for input(s): CHOL, HDL, LDLCALC, TRIG, CHOLHDL, LDLDIRECT in the last 72 hours. Thyroid  Function Tests: No results for input(s): TSH, T4TOTAL, FREET4, T3FREE, THYROIDAB in the last 72 hours. Anemia Panel: Recent Labs    06/10/24 1054  VITAMINB12 219   Sepsis Labs: No results for input(s): PROCALCITON, LATICACIDVEN in the last 168 hours.  No results found for this or any previous visit (from the past 240 hours).       Radiology Studies: US  RENAL Result Date:  06/11/2024 CLINICAL DATA:  [id number] AKI (acute kidney injury) [id number] EXAM: RENAL / URINARY TRACT ULTRASOUND COMPLETE COMPARISON:  October 14, 2015 FINDINGS: Right Kidney: Renal measurements: 11.0 x 4.9 x 3.3 cm = volume: 93 mL. Echogenicity within normal limits. No hydronephrosis visualized. Exophytic lobular contour along the interpolar to inferior kidney spanning approximately 2.3 x 2.1 cm (image 12 through 15, image 8) Left Kidney: Renal measurements: 11.9 x 5.1 x 4.8 = volume: 152 mL. Echogenicity within normal limits. No mass or hydronephrosis visualized. Bladder: Appears normal for degree of bladder distention. Other: None. IMPRESSION: 1. No hydronephrosis. 2. There is a lobular exophytic contour along the interpolar to inferior right kidney which may reflect benign renal tissue versus an underlying lesion. When clinically appropriate, recommend further evaluation with dedicated renal protocol CT or MRI with and without contrast to exclude underlying mass. Electronically Signed   By: Corean Salter M.D.   On: 06/11/2024 13:32  Scheduled Meds:  cyanocobalamin   1,000 mcg Intramuscular Q1200   Followed by   NOREEN ON 06/14/2024] vitamin B-12  1,000 mcg Oral Daily   enoxaparin  (LOVENOX ) injection  30 mg Subcutaneous Q24H   feeding supplement  237 mL Oral BID BM   gabapentin   100 mg Oral TID   pantoprazole   40 mg Oral Daily   prednisoLONE  acetate  1 drop Right Eye QID   valACYclovir   1,000 mg Oral Daily   Vitamin D  (Ergocalciferol )  50,000 Units Oral Q7 days   Continuous Infusions:  sodium chloride  100 mL/hr at 06/12/24 0826     LOS: 1 day      Calvin KATHEE Robson, MD [facility]   If 7PM-7AM, please contact night-coverage  06/12/2024, 3:06 PM

## 2024-06-13 ENCOUNTER — Other Ambulatory Visit: Payer: Self-pay

## 2024-06-13 LAB — BASIC METABOLIC PANEL WITH GFR
Anion gap: 9 (ref 5–15)
BUN: 14 mg/dL (ref 8–23)
CO2: 25 mmol/L (ref 22–32)
Calcium: 9.2 mg/dL (ref 8.9–10.3)
Chloride: 104 mmol/L (ref 98–111)
Creatinine, Ser: 1.43 mg/dL — ABNORMAL HIGH (ref 0.44–1.00)
GFR, Estimated: 37 mL/min — ABNORMAL LOW (ref >60)
Glucose, Bld: 119 mg/dL — ABNORMAL HIGH (ref 70–99)
Potassium: 3.5 mmol/L (ref 3.5–5.1)
Sodium: 138 mmol/L (ref 135–145)

## 2024-06-13 MED ORDER — PREDNISOLONE ACETATE 1 % OP SUSP
1.0000 [drp] | Freq: Four times a day (QID) | OPHTHALMIC | 0 refills | Status: DC
  Filled 2024-06-13: qty 10, 50d supply, fill #0

## 2024-06-13 MED ORDER — ACETAMINOPHEN 325 MG PO TABS: 650.0000 mg | ORAL_TABLET | Freq: Four times a day (QID) | ORAL | Status: AC | PRN

## 2024-06-13 MED ORDER — ORAL CARE MOUTH RINSE: 15.0000 mL | OROMUCOSAL | Status: DC | PRN

## 2024-06-13 MED ORDER — GABAPENTIN 100 MG PO CAPS
100.0000 mg | ORAL_CAPSULE | Freq: Three times a day (TID) | ORAL | 0 refills | Status: DC
  Filled 2024-06-13: qty 30, 10d supply, fill #0

## 2024-06-13 MED ORDER — ONDANSETRON HCL 4 MG PO TABS
4.0000 mg | ORAL_TABLET | Freq: Four times a day (QID) | ORAL | 0 refills | Status: DC | PRN
  Filled 2024-06-13: qty 20, 5d supply, fill #0

## 2024-06-13 MED ORDER — VALACYCLOVIR HCL 1 G PO TABS
500.0000 mg | ORAL_TABLET | Freq: Three times a day (TID) | ORAL | 0 refills | Status: DC
  Filled 2024-06-13: qty 15, 10d supply, fill #0
  Filled ????-??-??: fill #0

## 2024-06-13 MED ORDER — HYDROCODONE-ACETAMINOPHEN 5-325 MG PO TABS
1.0000 | ORAL_TABLET | Freq: Four times a day (QID) | ORAL | 0 refills | Status: AC | PRN
  Filled 2024-06-13: qty 20, 5d supply, fill #0

## 2024-06-13 MED ORDER — VALACYCLOVIR HCL 500 MG PO TABS
ORAL_TABLET | ORAL | 0 refills | Status: AC
  Filled 2024-06-13: qty 32, 10d supply, fill #0

## 2024-06-13 NOTE — Plan of Care (Signed)
°  Problem: Skin Integrity: Goal: Risk for impaired skin integrity will decrease Outcome: Progressing   Problem: Elimination: Goal: Will not experience complications related to bowel motility Outcome: Progressing   Problem: Activity: Goal: Risk for activity intolerance will decrease Outcome: Progressing

## 2024-06-13 NOTE — Plan of Care (Signed)

## 2024-06-13 NOTE — Progress Notes (Signed)
 Patient is being discharged to home.  Patient is alert and oriented x4. She is ambulatory at will.  IV has been removed. Catheter and tip are intact. Gauze and tape applied to removal site. Belonging have been packed.  Discharge meds will be delivered at bedside. Patient discharge instructions have been reviewed and educated with patient.  Son will be picking her up from hospital.

## 2024-06-13 NOTE — Discharge Summary (Signed)
 Physician Discharge Summary  Caitlyn Miranda FMW:[id number] DOB: 08-Apr-1946 DOA: 06/09/2024  PCP: Valerio Melanie DASEN, NP  Admit date: 06/09/2024 Discharge date: 06/13/2024  Admitted From: Home Disposition:  Home  Recommendations for Outpatient Follow-up:  Follow up with PCP in 1-2 weeks Urgent ambulatory referral to ophthalmology  Home Health: No Equipment/Devices: None  Discharge Condition: Stable CODE STATUS: Full Diet recommendation: Regular  Brief/Interim Summary:  Caitlyn Miranda is a 78 y.o. female with medical history significant of GERD, presented with worsening of right sided face rash and right eye oversensitivity to light.   Symptoms started last Friday, when patient started to have a right-sided forehead rash and pain, was diagnosed with facial cellulitis and started on amoxicillin .  Over the weekend patient started develop photosensitivity in the right eye, had to wear sunglasses to avoid irritant and tearing.  Went to see ophthalmologist on Monday, when she was diagnosed with bacterial conjunctivitis and started on antibiotics eyedrops.  No significant improvement over the last few days and she started to have skin break up with shingles since Tuesday, yesterday ophthalmologist started on Valtrex  however patient developed significant nausea and has not been able to tolerate oral antiviral medications.   ED Course: Afebrile, nontachycardic blood pressure 150/90 O2 saturation 100% on room air.  ED physician did fluorescein  study, which did not show any corneal ulcers.  Ophthalmology was consulted by ED physician who recommended IV antiviral medications until patient able to tolerate p.o. and prednisone eyedrop every 6 hours.      Discharge Diagnoses:  Principal Problem:   Iritis Active Problems:   Herpes zoster iritis of right eye  # Right eye herpes iritis - Failed outpatient management - As per recommendation from ophthalmology,  S/p Acyclovir  IV DC'd on 12/13 due  to AKI Plan: Patient improving at time of discharge.  Will recommend 10-day course of Valtrex  500 mg 3 times daily and 10-day course of prednisone eyedrops 4 times daily.  Patient will require urgent referral to ophthalmology.  Placed at time of discharge.  10-day course prescribed.  As needed pain control prescribed.  # AKI most likely secondary to acyclovir  Creatinine approaching baseline at time of discharge   Discharge Instructions  Discharge Instructions     Ambulatory referral to Ophthalmology   Complete by: As directed    Herpetic iritis, hospital followup   Increase activity slowly   Complete by: As directed       Allergies as of 06/13/2024       Reactions   Biaxin [clarithromycin] Other (See Comments)   ulcers   Codeine Nausea Only, Other (See Comments)   Headache   Iodinated Contrast Media Rash        Medication List     STOP taking these medications    amoxicillin  500 MG capsule Commonly known as: AMOXIL    azithromycin 250 MG tablet Commonly known as: ZITHROMAX   methylPREDNISolone  4 MG Tbpk tablet Commonly known as: MEDROL  DOSEPAK   neomycin-polymyxin b-dexamethasone 3.5-10000-0.1 Susp Commonly known as: MAXITROL       TAKE these medications    acetaminophen  325 MG tablet Commonly known as: TYLENOL  Take 2 tablets (650 mg total) by mouth every 6 (six) hours as needed for mild pain (pain score 1-3) or fever (or Fever >/= 101).   gabapentin  100 MG capsule Commonly known as: NEURONTIN  Take 1 capsule (100 mg total) by mouth 3 (three) times daily.   HYDROcodone -acetaminophen  5-325 MG tablet Commonly known as: NORCO/VICODIN Take 1 tablet by mouth every 6 (  six) hours as needed for up to 5 days for severe pain (pain score 7-10).   ondansetron  4 MG tablet Commonly known as: ZOFRAN  Take 1 tablet (4 mg total) by mouth every 6 (six) hours as needed for nausea.   prednisoLONE  acetate 1 % ophthalmic suspension Commonly known as: PRED FORTE  Place 1  drop into the right eye 4 (four) times daily for 10 days.   PRESCRIPTION MEDICATION Apply 1 Application topically 2 (two) times daily. Triamcinolone  .1% /eucerin   valACYclovir  1000 MG tablet Commonly known as: VALTREX  Take 0.5 tablets (500 mg total) by mouth 3 (three) times daily for 10 days. What changed: how much to take        Follow-up Information     Cannady, Jolene T, NP. Schedule an appointment as soon as possible for a visit in 1 week(s).   Specialty: Nurse Practitioner Contact information: [address] [place] [address] [phone]         Mittie Gaskin, MD Follow up.   Specialty: Ophthalmology Why: Urgent referral to ophthalmology placed at time of discharge.  Needs followup appointment with 10 days of hospital DC Contact information: [address]   [place] [address] [phone]                Allergies[1]  Consultations: None   Procedures/Studies: US  RENAL Result Date: 06/11/2024 CLINICAL DATA:  [id number] AKI (acute kidney injury) [id number] EXAM: RENAL / URINARY TRACT ULTRASOUND COMPLETE COMPARISON:  October 14, 2015 FINDINGS: Right Kidney: Renal measurements: 11.0 x 4.9 x 3.3 cm = volume: 93 mL. Echogenicity within normal limits. No hydronephrosis visualized. Exophytic lobular contour along the interpolar to inferior kidney spanning approximately 2.3 x 2.1 cm (image 12 through 15, image 8) Left Kidney: Renal measurements: 11.9 x 5.1 x 4.8 = volume: 152 mL. Echogenicity within normal limits. No mass or hydronephrosis visualized. Bladder: Appears normal for degree of bladder distention. Other: None. IMPRESSION: 1. No hydronephrosis. 2. There is a lobular exophytic contour along the interpolar to inferior right kidney which may reflect benign renal tissue versus an underlying lesion. When clinically appropriate, recommend further evaluation with dedicated renal protocol CT or MRI with and without contrast to exclude underlying mass.  Electronically Signed   By: Corean Salter M.D.   On: 06/11/2024 13:32      Subjective: Seen and examined on the day of discharge.  Stable no distress.  Appropriate for discharge home.  Discharge Exam: Vitals:   06/13/24 0356 06/13/24 0821  BP: (!) 161/75 (!) 177/75  Pulse: 66 71  Resp: 15 16  Temp: 97.6 F (36.4 C) 98.5 F (36.9 C)  SpO2: 99% 98%   Vitals:   06/12/24 1721 06/12/24 1939 06/13/24 0356 06/13/24 0821  BP: 134/63 139/61 (!) 161/75 (!) 177/75  Pulse: 70 73 66 71  Resp: 16 16 15 16   Temp: 99.2 F (37.3 C) 99 F (37.2 C) 97.6 F (36.4 C) 98.5 F (36.9 C)  TempSrc:  Oral    SpO2: 97% 99% 99% 98%  Weight:      Height:        General: Pt is alert, awake, not in acute distress Cardiovascular: RRR, S1/S2 +, no rubs, no gallops Respiratory: CTA bilaterally, no wheezing, no rhonchi Abdominal: Soft, NT, ND, bowel sounds + Extremities: no edema, no cyanosis    The results of significant diagnostics from this hospitalization (including imaging, microbiology, ancillary and laboratory) are listed below for reference.     Microbiology: No results found  for this or any previous visit (from the past 240 hours).   Labs: BNP (last 3 results) No results for input(s): BNP in the last 8760 hours. Basic Metabolic Panel: Recent Labs  Lab 06/10/24 0614 06/11/24 0506 06/11/24 1309 06/12/24 0523 06/13/24 0949  NA 134* 134* 135 141 138  K 3.3* 3.6 3.5 3.8 3.5  CL 100 102 102 106 104  CO2 25 22 22 22 25   GLUCOSE 100* 107* 92 91 119*  BUN 11 22 23 20 14   CREATININE 0.75 2.21* 2.43* 2.16* 1.43*  CALCIUM 8.8* 8.7* 8.6* 8.6* 9.2  MG 1.9 2.0  --   --   --   PHOS 3.0 4.0  --   --   --    Liver Function Tests: No results for input(s): AST, ALT, ALKPHOS, BILITOT, PROT, ALBUMIN in the last 168 hours. No results for input(s): LIPASE, AMYLASE in the last 168 hours. No results for input(s): AMMONIA in the last 168 hours. CBC: Recent Labs  Lab  06/09/24 0916 06/11/24 0506  WBC 8.6 7.3  HGB 14.6 11.9*  HCT 42.3 36.6  MCV 81.0 86.1  PLT 305 221   Cardiac Enzymes: No results for input(s): CKTOTAL, CKMB, CKMBINDEX, TROPONINI in the last 168 hours. BNP: Invalid input(s): POCBNP CBG: No results for input(s): GLUCAP in the last 168 hours. D-Dimer No results for input(s): DDIMER in the last 72 hours. Hgb A1c No results for input(s): HGBA1C in the last 72 hours. Lipid Profile No results for input(s): CHOL, HDL, LDLCALC, TRIG, CHOLHDL, LDLDIRECT in the last 72 hours. Thyroid  function studies No results for input(s): TSH, T4TOTAL, T3FREE, THYROIDAB in the last 72 hours.  Invalid input(s): FREET3 Anemia work up No results for input(s): VITAMINB12, FOLATE, FERRITIN, TIBC, IRON, RETICCTPCT in the last 72 hours. Urinalysis    Component Value Date/Time   APPEARANCEUR Clear 01/27/2022 1026   GLUCOSEU Negative 01/27/2022 1026   BILIRUBINUR Negative 01/27/2022 1026   PROTEINUR Negative 01/27/2022 1026   NITRITE Negative 01/27/2022 1026   LEUKOCYTESUR 1+ (A) 01/27/2022 1026   Sepsis Labs Recent Labs  Lab 06/09/24 0916 06/11/24 0506  WBC 8.6 7.3   Microbiology No results found for this or any previous visit (from the past 240 hours).   Time coordinating discharge: 40 minutes  SIGNED:   Calvin KATHEE Robson, MD  [facility] 06/13/2024, 11:12 AM Pager   If 7PM-7AM, please contact night-coverage     [1]  Allergies Allergen Reactions   Biaxin [Clarithromycin] Other (See Comments)    ulcers   Codeine Nausea Only and Other (See Comments)    Headache   Iodinated Contrast Media Rash

## 2024-06-13 NOTE — Care Management Important Message (Signed)
 Important Message  Patient Details  Name: Caitlyn Miranda MRN: [id number] Date of Birth: 05-31-1946   Important Message Given:  Yes - Medicare IM     Caitlyn Miranda W, CMA 06/13/2024, 10:55 AM

## 2024-06-13 NOTE — TOC CM/SW Note (Signed)
 Transition of Care [facility]) - Inpatient Brief Assessment   Patient Details  Name: Caitlyn Miranda MRN: [id number] Date of Birth: Jan 21, 1946  Transition of Care [facility]) CM/SW Contact:    Daved JONETTA Hamilton, RN Phone Number: 06/13/2024, 11:05 AM   Clinical Narrative:   Transition of Care [facility]) Screening Note   Patient Details  Name: Caitlyn Miranda Date of Birth: 1945-09-20   Transition of Care [facility]) CM/SW Contact:    Daved JONETTA Hamilton, RN Phone Number: 06/13/2024, 11:05 AM   Housing resources added to AVS  Transition of Care Department [facility]) has reviewed patient and no TOC needs have been identified at this time. If new patient transition needs arise, please place a TOC consult.    Transition of Care Asessment: Insurance and Status: Insurance coverage has been reviewed Patient has primary care physician: Yes   Prior level of function:: Independent Prior/Current Home Services: No current home services Social Drivers of Health Review: SDOH reviewed interventions complete Readmission risk has been reviewed: Yes Transition of care needs: no transition of care needs at this time

## 2024-06-13 NOTE — Discharge Instructions (Signed)
 Rent/Utility/Housing  Agency Name: [facility] Agency Address: [address] [place], [place] [address] Phone: [phone] Email: [email or web address] Website: [email or web address] Service(s) Offered: Housing services, self-sufficiency, congregate meal program, weatherization program, Field seismologist program, emergency food assistance,  housing counseling, home ownership program, wheels -towork program.  Agency Name: [facility] Address: [address] [place], [place] [address] Phone: [phone] (8a-4p) [phone] (8p- 10p) Email: [email or web address] Website: [email or web address] Service(s) Offered: A program for homeless and/or needy men that includes one-on-one counseling, life skills training and job rehabilitation.  Agency Name: [employer] [facility] [place] Address: [address] [place], [place] [address] Phone: [phone] Website: www.alliedchurches.org Service(s) Offered: Assistance to needy in emergency with utility bills, heating fuel, and prescriptions. Shelter for homeless 7pm-7am. October 23, 2016 15  Agency Name: [facility] [place] [facility] Address: [address], [place], [place] [address] Phone: [phone] Contact Person: Cathleen Corti Email: [email or web address] Website: [email or web address].[email or web address] Service(s) Offered: Helps individuals with developmental disabilities move from housing that is more restrictive to homes where they  can achieve greater independence and have more  opportunities.  Agency Name: [facility] Address: [address], [place], [place] [address] Phone: [phone] Email: [email or web address] Website: www.[email or web address].org Service(s) Offered: Provides affordable housing for low-income families, elderly, and disabled individuals. Offer a wide range of  programs and services, from financial planning to  afterschool and summer programs.  Agency Name: [facility] Address: [address] [place], [place] [address] Phone: [phone] Service(s) Offered: Child support services; child welfare services; food stamps; Medicaid; work first family assistance; and aid with fuel,  rent, food and medicine.  Agency Name: [facility] [place] [employer]. Address: [facility] [address], [place], [place]  [address] Phone: [phone] Website: www.familyabuseservices.org Service(s) Offered: 24 hour Crisis Line: [phone]) 136-2819; 24 hour Emergency Shelter; Transitional Housing; Support Groups; Scientist, physiological; [employer]; Hispanic Outreach: [phone];  Visitation Center: [phone].  Agency Name: [facility] [place]. Address: [address] [place], [place] [address] Phone: [phone] Service(s) Offered: CAP Services; Home and [employer]; Individual or Group Supports; Respite Care Non-Institutional Nursing;  Residential Supports; Respite Care and Personal Care Services; Transportation; Family and Friends Night; Recreational Activities; Three Nutritious Meals/Snacks; Consultation with Registered Dietician; Twenty-four hour Registered Nurse Access; Daily and [employer]; Camp [employer]aves; [place] for the [employer] (During Summer Months) Bingo Night (Every  Wednesday Night); Special Populations Dance Night  (Every Tuesday Night); Professional Hair Care Services.  Agency Name: [facility] Address: [address], [place], [place] [address] Phone: [phone] Contact Person: Jabier Mutton Website: [email or web address].Physicist, medical) Offered: Residential treatment facility for women; food and  clothing, educational & employment development and  transportation to work; Counsellor of financial skills;  parenting and family reunification; emotional and spiritual  support;  transitional housing for program graduates.  Agency Name: [facility] Address: [address] [place], [place] [address] Phone: [phone] Email: [email or web address] Website: TaskTown.es Service(s) Offered: Public housing units for elderly, disabled, and low income people; housing choice vouchers for income eligible  applicants; shelter plus care vouchers; and Psychologist, clinical.  Agency Name: [employer] [facility] [employer] [facility] [employer] Address: [address] [place], [place] [address] Phone: [phone] Email: habitat1@[email or web address] Website: www.habitatalamance.org Service(s) Offered: Build houses for families in need of decent housing. Each adult in the family must invest 200 hours of labor on  someone else's house, work with volunteers to build their own house, attend classes  on budgeting, home maintenance, yard care, and attend homeowner association meetings.  Agency Name: [facility] Address: [address], [place], [place] [address] Phone: [phone] Website: www.rsli.org Service(s) Offered: Intermediate care facilities for intellectually delayed, Supervised Living in group homes for adults with developmental disabilities, Supervised Living for people who have dual diagnoses (MRMI), Independent Living, Supported Living, respite and a variety of CAP services, pre-vocational services, day supports, and [employer].  Agency Name: [place] [employer] Phone: [phone] Website: www.NCForeclosurePrevention.gov Service(s) Offered: Zero-interest, deferred loans to homeowners struggling to pay their mortgage. Call for more information.

## 2024-06-14 ENCOUNTER — Telehealth: Payer: Self-pay | Admitting: Pediatrics

## 2024-06-14 NOTE — Telephone Encounter (Signed)
 Copied from CRM #[id number]. Topic: Appointments - Scheduling Inquiry for Clinic >> Jun 14, 2024  9:06 AM Caitlyn Miranda wrote: Reason for CRM: Pt was discharged from hospital on 06/13/24 and needs hospital follow up with primary, next available is 07/16/2023 and needs to be seen within 14 days.   PT does not want to be put with Darice Petty due to not having a patient doctor connection, not having confidence to share. Provider is good provider just not meant to be. Rather see her primary because she feels more comfortable with being able to disclose anything needed.   concerned about Kidneys because of the antiviral medication. Caused kidney function to get out of whack. One of the reasons to follow up and shingles is on face and around eye   Pls follow up with pt before end of day [phone]

## 2024-06-14 NOTE — Telephone Encounter (Signed)
 Spoke to patient and confirmed that she is already scheduled for a hospital follow up with Jolene on 12/29

## 2024-06-25 DIAGNOSIS — N179 Acute kidney failure, unspecified: Secondary | ICD-10-CM | POA: Insufficient documentation

## 2024-06-25 DIAGNOSIS — E559 Vitamin D deficiency, unspecified: Secondary | ICD-10-CM | POA: Insufficient documentation

## 2024-06-25 NOTE — Patient Instructions (Incomplete)
 Start Vitamin D3 2000 units daily and B12 1000 MCG daily.  Please call to schedule your mammogram and/or bone density: [facility]  Address: [address], [place], [place] [address] Phone: [phone]  [facility] [address] [place],  [place]  [address] Phone: [phone]    Nerve Pain After Shingles Postherpetic neuralgia (PHN) is nerve pain you may get after you have shingles. Shingles is an infection that causes a painful rash and blisters. It's caused by the same germ that causes chickenpox. PHN affects the spot on your body where you had the shingles rash. It can last for 3 months after your rash has gone away. What are the causes? PHN may be caused by damage to your nerves. This damage may come from swelling from the shingles infection. What increases the risk? You may be more likely to get PHN if: You're older than 78 years of age. You have severe pain before your rash starts. You have a very bad rash. You have shingles in and around your eye. Your body defense system (immune system) is weak. What are the signs or symptoms? The main symptom of PHN is pain. The pain may: Be stabbing, burning, or shooting. Feel like an electric shock. Come and go, or it may be there all the time. Get worse if: Something touches your skin. The temperature goes up or down. You may also have itching. How is this diagnosed? PHN may be diagnosed based on: Your symptoms. Whether you've had shingles before. How is this treated? There's no cure, but treatment can help with the pain. Normal pain medicines may not help. You may need to work with an expert in treating pain to find what works best for you. Treatment may include: Anti-seizure medicines. Antidepressants. Strong pain medicines. A numbing patch worn on the skin. Shots of: Numbing medicines. Medicines to treat inflammation. Botulinum toxin. This  can block pain signals and stop you from feeling pain. Follow these instructions at home: Medicines Take over-the-counter and prescription medicines only as told by your health care provider. Ask your provider if the medicine prescribed to you: Requires you to avoid driving or using machinery. Can cause trouble pooping or constipation. You may need to take these actions to prevent or treat trouble pooping: Drink enough fluid to keep your pee (urine) pale yellow. Take over-the-counter or prescription medicines. Eat foods that are high in fiber, such as beans, whole grains, and fresh fruits and vegetables. Limit foods that are high in fat and processed sugars, such as fried or sweet foods. Managing pain  If told, put ice on the painful area. Put ice in a plastic bag. Place a towel between your skin and the bag. Leave the ice on for 20 minutes, 2-3 times a day. If your skin turns bright red, remove the ice right away to prevent skin damage. The risk of damage is higher if you can't feel pain, heat, or cold. Cover sensitive spots with a bandage, or dressing, to stop clothes from rubbing. Wear loose clothes. General instructions It may take a long time for you to get better. Work closely with your provider. Think about talking with a mental health care provider. They can help you find ways to cope with feeling overwhelmed or hopeless. Have a good support system at home. Think about joining a pain support group. How is this prevented? Vaccines are the best way to prevent shingles and PHN. You  should get the vaccine shot for shingles once you're older than 78 years of age. Talk with your provider about getting the shot. Contact a health care provider if: Your medicine isn't helping. You can't manage your pain at home. You feel sad or depressed. Get help right away if: You have thoughts about hurting yourself or others. Get help right away if you feel like you may hurt yourself or others, or  have thoughts about taking your own life. Go to your nearest emergency room or: Call 911. Call the National Suicide Prevention Lifeline at (270)770-2347 or 988. This is open 24 hours a day. Text the Crisis Text Line at 781-535-6858. This information is not intended to replace advice given to you by your health care provider. Make sure you discuss any questions you have with your health care provider. Document Revised: 09/18/2022 Document Reviewed: 09/18/2022 Elsevier Patient Education  2024 [employer].

## 2024-06-27 ENCOUNTER — Ambulatory Visit (INDEPENDENT_AMBULATORY_CARE_PROVIDER_SITE_OTHER): Admitting: Nurse Practitioner

## 2024-06-27 ENCOUNTER — Encounter: Payer: Self-pay | Admitting: Nurse Practitioner

## 2024-06-27 VITALS — BP 138/80 | HR 86 | Temp 98.5°F | Resp 17 | Ht 62.01 in | Wt 180.0 lb

## 2024-06-27 DIAGNOSIS — E7800 Pure hypercholesterolemia, unspecified: Secondary | ICD-10-CM | POA: Diagnosis not present

## 2024-06-27 DIAGNOSIS — Z780 Asymptomatic menopausal state: Secondary | ICD-10-CM

## 2024-06-27 DIAGNOSIS — B0232 Zoster iridocyclitis: Secondary | ICD-10-CM

## 2024-06-27 DIAGNOSIS — R7989 Other specified abnormal findings of blood chemistry: Secondary | ICD-10-CM

## 2024-06-27 DIAGNOSIS — Z1231 Encounter for screening mammogram for malignant neoplasm of breast: Secondary | ICD-10-CM | POA: Diagnosis not present

## 2024-06-27 DIAGNOSIS — N179 Acute kidney failure, unspecified: Secondary | ICD-10-CM | POA: Diagnosis not present

## 2024-06-27 DIAGNOSIS — I10 Essential (primary) hypertension: Secondary | ICD-10-CM | POA: Diagnosis not present

## 2024-06-27 NOTE — Progress Notes (Signed)
 "  BP 138/80 (BP Location: Left Arm, Patient Position: Sitting, Cuff Size: Normal)   Pulse 86   Temp 98.5 F (36.9 C)   Resp 17   Ht 5' 2.01 (1.575 m)   Wt 180 lb (81.6 kg)   SpO2 96%   BMI 32.91 kg/m    Subjective:    Patient ID: Caitlyn Miranda, female    DOB: 1945-12-20, 78 y.o.   MRN: [id number]  HPI: Caitlyn Miranda is a 78 y.o. female  Chief Complaint  Patient presents with   Hospitalization Follow-up    States vision in right eye is still messed up, was being treated with prednisone eye drops.    Transition of Care Hospital Follow up.  Admitted to [facility] on 06/09/24 for shingles right eye (iritis). Was treated with IV Acyclovir  which did cause some AKI, however she did not tolerate oral antivirals. Labs were improving some on discharge back towards baseline. They placed urgent referral to ophthalmology for outpatient visit. Initially treated for sinus infection on 06/06/24, the abx therapy she reports caused nausea and vomiting. Was severally dehydrated when present to hospital she reported. Saw Dr. Mevelyn on 06/07/24 who started her on antiviral, but she could not keep down as was already nauseous from abx therapy. Also prescribed compounded cream to place on rash, which she reports was beneficial.    States he vision is still not right in her right eye, if tilts her head to left or drops head down her vision will clear.  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 Still having nerve pain to facial area and top of head. Reports pain varies strength wise and takes extra strength Tylenol  which helps pain. Has completed steroid eye drops. Has been back to see Dr. Mevelyn since discharge. Hospital had sent her to [facility], but could not see Dr. Mittie who she had seen before. Saw different provider there, they wanted to place her on steroid eye drop but she told them she had already been on that. Was given abx eye drop  because he reported she had infection and pressures were up, ut when got home read online not to take that eye drop for infection [facility] clinic site). She went back to Dr. Mevelyn instead after this who rechecked pressures.  Elevated TSH on past labs, no symptoms present with this.  Was taking Biotin at the time. Does not take statin therapy due to her husband having terrible side effects with these in past and being in hospital often.  She has not eaten this morning.  Brief/Interim Summary:  Caitlyn Miranda is a 78 y.o. female with medical history significant of GERD, presented with worsening of right sided face rash and right eye oversensitivity to light.   Symptoms started last Friday, when patient started to have a right-sided forehead rash and pain, was diagnosed with facial cellulitis and started on amoxicillin .  Over the weekend patient started develop photosensitivity in the right eye, had to wear sunglasses to avoid irritant and tearing.  Went to see ophthalmologist on Monday, when she was diagnosed with bacterial conjunctivitis and started on antibiotics eyedrops.  No significant improvement over the last few days and she started to have skin break up with shingles since Tuesday, yesterday ophthalmologist started on Valtrex  however patient developed significant nausea and has not been able to tolerate oral antiviral medications.   ED Course: Afebrile, nontachycardic blood pressure 150/90 O2 saturation 100% on room air.  ED physician did fluorescein  study, which did not show any corneal ulcers.  Ophthalmology was consulted by ED physician who recommended IV antiviral medications until patient able to tolerate p.o. and prednisone eyedrop every 6 hours.  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  Will recommend 10-day course of Valtrex  500 mg 3  times daily and 10-day course of prednisone eyedrops 4 times daily.  Patient will require urgent referral to ophthalmology.  Placed at time of discharge.  10-day course prescribed.  As needed pain control prescribed.   # AKI most likely secondary to acyclovir  Creatinine approaching baseline at time of discharge  Hospital/Facility: [facility] D/C Physician:  D/C Date: 06/13/24  Records Requested: 06/27/24 Records Received: 06/27/24 Records Reviewed: 06/27/24  Diagnoses on Discharge: Iritis due to shingles  Date of interactive Contact within 48 hours of discharge:  Contact was through: none present  Date of 7 day or 14 day face-to-face visit:  14 days since discharge  Outpatient Encounter Medications as of 06/27/2024  Medication Sig   acetaminophen  (TYLENOL ) 325 MG tablet Take 2 tablets (650 mg total) by mouth every 6 (six) hours as needed for mild pain (pain score 1-3) or fever (or Fever >/= 101).   [DISCONTINUED] gabapentin  (NEURONTIN ) 100 MG capsule Take 1 capsule (100 mg total) by mouth 3 (three) times daily. (Patient not taking: Reported on 06/27/2024)   [DISCONTINUED] ondansetron  (ZOFRAN ) 4 MG tablet Take 1 tablet (4 mg total) by mouth every 6 (six) hours as needed for nausea. (Patient not taking: Reported on 06/27/2024)   [DISCONTINUED] prednisoLONE  acetate (PRED FORTE ) 1 % ophthalmic suspension Place 1 drop into the right eye 4 (four) times daily for 10 days. (Patient not taking: Reported on 06/27/2024)   [DISCONTINUED] PRESCRIPTION MEDICATION Apply 1 Application topically 2 (two) times daily. Triamcinolone  .1% /eucerin (Patient not taking: Reported on 06/27/2024)   No facility-administered encounter medications on file as of 06/27/2024.   Diagnostic Tests Reviewed/Disposition:     Latest Ref Rng & Units 06/11/2024    5:06 AM 06/09/2024    9:16 AM 01/27/2022   10:28 AM  CBC  WBC 4.0 - 10.5 K/uL 7.3  8.6  6.7   Hemoglobin 12.0 - 15.0 g/dL 88.0  85.3  86.1   Hematocrit 36.0 -  46.0 % 36.6  42.3  42.4   Platelets 150 - 400 K/uL 221  305  309        Latest Ref Rng & Units 06/13/2024    9:49 AM 06/12/2024    5:23 AM 06/11/2024    1:09 PM  CMP  Glucose 70 - 99 mg/dL 880  91  92   BUN 8 - 23 mg/dL 14  20  23    Creatinine 0.44 - 1.00 mg/dL 8.56  7.83  7.56   Sodium 135 - 145 mmol/L 138  141  135   Potassium 3.5 - 5.1 mmol/L 3.5  3.8  3.5   Chloride 98 - 111 mmol/L 104  106  102   CO2 22 - 32 mmol/L 25  22  22    Calcium 8.9 - 10.3 mg/dL 9.2  8.6  8.6     Consults: Ophthalmology   Discharge Instructions: Follow up with PCP in 1-2 weeks Urgent ambulatory referral to ophthalmology  Disease/illness Education: Reviewed at length with patient today and all questions answered  Home Health/Community Services Discussions/Referrals:  none  Establishment or re-establishment of referral orders for community resources: none  Discussion with other health care providers: Reviewed all recent notes  Assessment and Support of treatment regimen adherence: Reviewed at length with patient today and all questions answered  Appointments Coordinated with: Reviewed at length with patient today and all questions answered  Education for self-management, independent living, and ADLs:  Reviewed at length with patient today and all questions answered  Relevant past medical, surgical, family and social history reviewed and updated as indicated. Interim medical history since our last visit reviewed. Allergies and medications reviewed and updated.  Review of Systems  Constitutional:  Negative for activity change, appetite change, diaphoresis, fatigue and fever.  Eyes:  Positive for visual disturbance. Negative for pain, discharge and redness.  Respiratory:  Negative for cough, chest tightness, shortness of breath and wheezing.   Cardiovascular:  Negative for chest pain, palpitations and leg swelling.  Gastrointestinal: Negative.   Endocrine: Negative for cold intolerance and heat  intolerance.  Skin:  Positive for rash.  Neurological: Negative.   Psychiatric/Behavioral: Negative.     Per HPI unless specifically indicated above     Objective:    BP 138/80 (BP Location: Left Arm, Patient Position: Sitting, Cuff Size: Normal)   Pulse 86   Temp 98.5 F (36.9 C)   Resp 17   Ht 5' 2.01 (1.575 m)   Wt 180 lb (81.6 kg)   SpO2 96%   BMI 32.91 kg/m   Wt Readings from Last 3 Encounters:  06/27/24 180 lb (81.6 kg)  06/09/24 172 lb 9.9 oz (78.3 kg)  06/06/24 181 lb 3.2 oz (82.2 kg)    Physical Exam Vitals and nursing note reviewed.  Constitutional:      General: She is awake. She is not in acute distress.    Appearance: She is well-developed and well-groomed. She is obese. She is not ill-appearing or toxic-appearing.  HENT:     Head: Normocephalic.     Right Ear: Hearing and external ear normal.     Left Ear: Hearing and external ear normal.  Eyes:     General: Lids are normal.        Right eye: No discharge.        Left eye: No discharge.     Extraocular Movements: Extraocular movements intact.     Conjunctiva/sclera: Conjunctivae normal.     Right eye: Right conjunctiva is not injected.     Left eye: Left conjunctiva is not injected.     Pupils: Pupils are equal, round, and reactive to light.     Visual Fields: Right eye visual fields normal and left eye visual fields normal.  Neck:     Thyroid : No thyromegaly.     Vascular: No carotid bruit.  Cardiovascular:     Rate and Rhythm: Normal rate and regular rhythm.     Heart sounds: Normal heart sounds. No murmur heard.    No gallop.  Pulmonary:     Effort: Pulmonary effort is normal. No accessory muscle usage or respiratory distress.     Breath sounds: Normal breath sounds. No decreased breath sounds, wheezing or rales.  Abdominal:     General: Bowel sounds are normal. There is no distension.     Palpations: Abdomen is soft.     Tenderness: There is no abdominal tenderness.  Musculoskeletal:      Cervical back: Normal range of motion and neck supple.     Right lower leg: No edema.  Left lower leg: No edema.  Lymphadenopathy:     Cervical: No cervical adenopathy.  Skin:    General: Skin is warm and dry.     Findings: Rash present.     Comments: Rash with crusting present to above right eye and upper right scalp area. No vesicles.  Neurological:     Mental Status: She is alert and oriented to person, place, and time.     Deep Tendon Reflexes: Reflexes are normal and symmetric.     Reflex Scores:      Brachioradialis reflexes are 2+ on the right side and 2+ on the left side.      Patellar reflexes are 2+ on the right side and 2+ on the left side. Psychiatric:        Attention and Perception: Attention normal.        Mood and Affect: Mood normal.        Speech: Speech normal.        Behavior: Behavior normal. Behavior is cooperative.        Thought Content: Thought content normal.    Results for orders placed or performed during the hospital encounter of 06/09/24  CBC   Collection Time: 06/09/24  9:16 AM  Result Value Ref Range   WBC 8.6 4.0 - 10.5 K/uL   RBC 5.22 (H) 3.87 - 5.11 MIL/uL   Hemoglobin 14.6 12.0 - 15.0 g/dL   HCT 57.6 63.9 - 53.9 %   MCV 81.0 80.0 - 100.0 fL   MCH 28.0 26.0 - 34.0 pg   MCHC 34.5 30.0 - 36.0 g/dL   RDW 87.1 88.4 - 84.4 %   Platelets 305 150 - 400 K/uL   nRBC 0.0 0.0 - 0.2 %  Basic metabolic panel   Collection Time: 06/09/24  9:16 AM  Result Value Ref Range   Sodium 126 (L) 135 - 145 mmol/L   Potassium 3.5 3.5 - 5.1 mmol/L   Chloride 91 (L) 98 - 111 mmol/L   CO2 20 (L) 22 - 32 mmol/L   Glucose, Bld 116 (H) 70 - 99 mg/dL   BUN 12 8 - 23 mg/dL   Creatinine, Ser 9.34 0.44 - 1.00 mg/dL   Calcium 9.6 8.9 - 89.6 mg/dL   GFR, Estimated >39 >39 mL/min   Anion gap 15 5 - 15  Sodium   Collection Time: 06/09/24  9:48 PM  Result Value Ref Range   Sodium 135 135 - 145 mmol/L  Basic metabolic panel   Collection Time: 06/10/24  6:14 AM   Result Value Ref Range   Sodium 134 (L) 135 - 145 mmol/L   Potassium 3.3 (L) 3.5 - 5.1 mmol/L   Chloride 100 98 - 111 mmol/L   CO2 25 22 - 32 mmol/L   Glucose, Bld 100 (H) 70 - 99 mg/dL   BUN 11 8 - 23 mg/dL   Creatinine, Ser 9.24 0.44 - 1.00 mg/dL   Calcium 8.8 (L) 8.9 - 10.3 mg/dL   GFR, Estimated >39 >39 mL/min   Anion gap 10 5 - 15  Magnesium   Collection Time: 06/10/24  6:14 AM  Result Value Ref Range   Magnesium 1.9 1.7 - 2.4 mg/dL  Phosphorus   Collection Time: 06/10/24  6:14 AM  Result Value Ref Range   Phosphorus 3.0 2.5 - 4.6 mg/dL  Vitamin B12   Collection Time: 06/10/24 10:54 AM  Result Value Ref Range   Vitamin B-12 219 180 - 914 pg/mL  VITAMIN D  25  Hydroxy (Vit-D Deficiency, Fractures)   Collection Time: 06/10/24 10:54 AM  Result Value Ref Range   Vit D, 25-Hydroxy 23.73 (L) 30 - 100 ng/mL  Osmolality   Collection Time: 06/10/24  3:35 PM  Result Value Ref Range   Osmolality 280 275 - 295 mOsm/kg  CBC   Collection Time: 06/11/24  5:06 AM  Result Value Ref Range   WBC 7.3 4.0 - 10.5 K/uL   RBC 4.25 3.87 - 5.11 MIL/uL   Hemoglobin 11.9 (L) 12.0 - 15.0 g/dL   HCT 63.3 63.9 - 53.9 %   MCV 86.1 80.0 - 100.0 fL   MCH 28.0 26.0 - 34.0 pg   MCHC 32.5 30.0 - 36.0 g/dL   RDW 86.4 88.4 - 84.4 %   Platelets 221 150 - 400 K/uL   nRBC 0.0 0.0 - 0.2 %  Basic metabolic panel with GFR   Collection Time: 06/11/24  5:06 AM  Result Value Ref Range   Sodium 134 (L) 135 - 145 mmol/L   Potassium 3.6 3.5 - 5.1 mmol/L   Chloride 102 98 - 111 mmol/L   CO2 22 22 - 32 mmol/L   Glucose, Bld 107 (H) 70 - 99 mg/dL   BUN 22 8 - 23 mg/dL   Creatinine, Ser 7.78 (H) 0.44 - 1.00 mg/dL   Calcium 8.7 (L) 8.9 - 10.3 mg/dL   GFR, Estimated 22 (L) >60 mL/min   Anion gap 10 5 - 15  Magnesium   Collection Time: 06/11/24  5:06 AM  Result Value Ref Range   Magnesium 2.0 1.7 - 2.4 mg/dL  Phosphorus   Collection Time: 06/11/24  5:06 AM  Result Value Ref Range   Phosphorus 4.0 2.5 -  4.6 mg/dL  Basic metabolic panel   Collection Time: 06/11/24  1:09 PM  Result Value Ref Range   Sodium 135 135 - 145 mmol/L   Potassium 3.5 3.5 - 5.1 mmol/L   Chloride 102 98 - 111 mmol/L   CO2 22 22 - 32 mmol/L   Glucose, Bld 92 70 - 99 mg/dL   BUN 23 8 - 23 mg/dL   Creatinine, Ser 7.56 (H) 0.44 - 1.00 mg/dL   Calcium 8.6 (L) 8.9 - 10.3 mg/dL   GFR, Estimated 20 (L) >60 mL/min   Anion gap 11 5 - 15  Basic metabolic panel with GFR   Collection Time: 06/12/24  5:23 AM  Result Value Ref Range   Sodium 141 135 - 145 mmol/L   Potassium 3.8 3.5 - 5.1 mmol/L   Chloride 106 98 - 111 mmol/L   CO2 22 22 - 32 mmol/L   Glucose, Bld 91 70 - 99 mg/dL   BUN 20 8 - 23 mg/dL   Creatinine, Ser 7.83 (H) 0.44 - 1.00 mg/dL   Calcium 8.6 (L) 8.9 - 10.3 mg/dL   GFR, Estimated 23 (L) >60 mL/min   Anion gap 13 5 - 15  Basic metabolic panel   Collection Time: 06/13/24  9:49 AM  Result Value Ref Range   Sodium 138 135 - 145 mmol/L   Potassium 3.5 3.5 - 5.1 mmol/L   Chloride 104 98 - 111 mmol/L   CO2 25 22 - 32 mmol/L   Glucose, Bld 119 (H) 70 - 99 mg/dL   BUN 14 8 - 23 mg/dL   Creatinine, Ser 8.56 (H) 0.44 - 1.00 mg/dL   Calcium 9.2 8.9 - 89.6 mg/dL   GFR, Estimated 37 (L) >60 mL/min   Anion gap 9  5 - 15      Assessment & Plan:   Problem List Items Addressed This Visit       Cardiovascular and Mediastinum   Hypertension   Chronic with no current medications. BP at goal for age today. Will continue to monitor. Did not tolerate Losartan  or Lisinopril  in past. Recommend she monitor BP at least a few mornings a week at home and document.  DASH diet at home. Restart medication as needed, possibly Amlodipine. Labs today: CBC and CMP.         Relevant Orders   Comprehensive metabolic panel with GFR     Genitourinary   AKI (acute kidney injury)   While in hospital 06/11/24 secondary to dehydration and Acyclovir  use. Recheck labs today and monitor closely.  Recommend to continue increased  water intake. Avoid Ibuprofen products.      Relevant Orders   Comprehensive metabolic panel with GFR   CBC with Differential/Platelet     Other   Hypercholesteremia   Chronic, ongoing. Refuses statin therapy due to concerns about ADR and her husband's past experiences with this. Check Lipid panel today and focus heavily on diet and regular activity. Could consider Zetia trial if needed in future.      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 Will continue collaboration with ophthalmology, Dr. Mevelyn. She prefers not to restart Gabapentin . Will continue Tylenol  as needed for pain. Educated her on shingles and period of time it can take to heal. Would benefit Shingrix in future.      Elevated TSH   Noted on past labs, but was taking Biotin at the time. Recheck today.      Relevant Orders   TSH   T4, free   Other Visit Diagnoses       Postmenopausal estrogen deficiency       Bone density ordered and instructed how to schedule.   Relevant Orders   DG Bone Density     Encounter for screening mammogram for malignant neoplasm of breast       Mammogram ordered and instructed how to schedule.   Relevant Orders   MM 3D SCREENING MAMMOGRAM BILATERAL BREAST        Follow up plan: Return for as scheduled at the end of January.      "

## 2024-06-27 NOTE — Assessment & Plan Note (Signed)
 Chronic with no current medications. BP at goal for age today. Will continue to monitor. Did not tolerate Losartan  or Lisinopril  in past. Recommend she monitor BP at least a few mornings a week at home and document.  DASH diet at home. Restart medication as needed, possibly Amlodipine. Labs today: CBC and CMP.

## 2024-06-27 NOTE — Assessment & Plan Note (Signed)
 Noted on past labs, but was taking Biotin at the time. Recheck today.

## 2024-06-27 NOTE — Assessment & Plan Note (Signed)
 Chronic, ongoing. Refuses statin therapy due to concerns about ADR and her husband's past experiences with this. Check Lipid panel today and focus heavily on diet and regular activity. Could consider Zetia trial if needed in future.

## 2024-06-27 NOTE — Assessment & Plan Note (Addendum)
 Acute and improving. Will continue collaboration with ophthalmology, Dr. Mevelyn. She prefers not to restart Gabapentin . Will continue Tylenol  as needed for pain. Educated her on shingles and period of time it can take to heal. Would benefit Shingrix in future.

## 2024-06-27 NOTE — Assessment & Plan Note (Signed)
 While in hospital 06/11/24 secondary to dehydration and Acyclovir  use. Recheck labs today and monitor closely.  Recommend to continue increased water intake. Avoid Ibuprofen products.

## 2024-06-28 ENCOUNTER — Ambulatory Visit: Payer: Self-pay | Admitting: Nurse Practitioner

## 2024-06-28 LAB — COMPREHENSIVE METABOLIC PANEL WITH GFR
ALT: 7 IU/L (ref 0–32)
AST: 17 IU/L (ref 0–40)
Albumin: 4.5 g/dL (ref 3.8–4.8)
Alkaline Phosphatase: 66 IU/L (ref 49–135)
BUN/Creatinine Ratio: 12 (ref 12–28)
BUN: 9 mg/dL (ref 8–27)
Bilirubin Total: 0.5 mg/dL (ref 0.0–1.2)
CO2: 18 mmol/L — ABNORMAL LOW (ref 20–29)
Calcium: 9.7 mg/dL (ref 8.7–10.3)
Chloride: 102 mmol/L (ref 96–106)
Creatinine, Ser: 0.77 mg/dL (ref 0.57–1.00)
Globulin, Total: 2.2 g/dL (ref 1.5–4.5)
Glucose: 99 mg/dL (ref 70–99)
Potassium: 4 mmol/L (ref 3.5–5.2)
Sodium: 138 mmol/L (ref 134–144)
Total Protein: 6.7 g/dL (ref 6.0–8.5)
eGFR: 79 mL/min/1.73

## 2024-06-28 LAB — CBC WITH DIFFERENTIAL/PLATELET
Basophils Absolute: 0.1 x10E3/uL (ref 0.0–0.2)
Basos: 1 %
EOS (ABSOLUTE): 0.1 x10E3/uL (ref 0.0–0.4)
Eos: 2 %
Hematocrit: 40.6 % (ref 34.0–46.6)
Hemoglobin: 13.3 g/dL (ref 11.1–15.9)
Immature Grans (Abs): 0 x10E3/uL (ref 0.0–0.1)
Immature Granulocytes: 0 %
Lymphocytes Absolute: 3.2 x10E3/uL — ABNORMAL HIGH (ref 0.7–3.1)
Lymphs: 41 %
MCH: 29 pg (ref 26.6–33.0)
MCHC: 32.8 g/dL (ref 31.5–35.7)
MCV: 89 fL (ref 79–97)
Monocytes Absolute: 0.6 x10E3/uL (ref 0.1–0.9)
Monocytes: 8 %
Neutrophils Absolute: 3.7 x10E3/uL (ref 1.4–7.0)
Neutrophils: 48 %
Platelets: 437 x10E3/uL (ref 150–450)
RBC: 4.58 x10E6/uL (ref 3.77–5.28)
RDW: 13.9 % (ref 11.7–15.4)
WBC: 7.7 x10E3/uL (ref 3.4–10.8)

## 2024-06-28 LAB — T4, FREE: Free T4: 1 ng/dL (ref 0.82–1.77)

## 2024-06-28 LAB — TSH: TSH: 4.82 u[IU]/mL — ABNORMAL HIGH (ref 0.450–4.500)

## 2024-06-28 LAB — LIPID PANEL W/O CHOL/HDL RATIO
Cholesterol, Total: 218 mg/dL — ABNORMAL HIGH (ref 100–199)
HDL: 58 mg/dL
LDL Chol Calc (NIH): 126 mg/dL — ABNORMAL HIGH (ref 0–99)
Triglycerides: 194 mg/dL — ABNORMAL HIGH (ref 0–149)
VLDL Cholesterol Cal: 34 mg/dL (ref 5–40)

## 2024-06-28 NOTE — Progress Notes (Signed)
 Contacted via MyChart  Good evening Sitlaly, your labs have returned: - Kidney function, creatinine and eGFR, remains normal, as is liver function, AST and ALT.  - Lipid panel is showing elevations, a little trend down from past checks. I know you prefer not to take statin therapy and we can always discuss alternatives at future visits. Omega 3 supplement is good to take as well. - Thyroid , TSH is elevated, but Free T4 normal. We will recheck this in January. - CBC overall stable. Any questions? Keep being amazing!!  Thank you for allowing me to participate in your care.  I appreciate you. Kindest regards, Shadee Rathod

## 2024-07-16 NOTE — Patient Instructions (Incomplete)
 Be Involved in Caring For Your Health:  Taking Medications When medications are taken as directed, they can greatly improve your health. But if they are not taken as prescribed, they may not work. In some cases, not taking them correctly can be harmful. To help ensure your treatment remains effective and safe, understand your medications and how to take them. Bring your medications to each visit for review by your provider.  Your lab results, notes, and after visit summary will be available on My Chart. We strongly encourage you to use this feature. If lab results are abnormal the clinic will contact you with the appropriate steps. If the clinic does not contact you assume the results are satisfactory. You can always view your results on My Chart. If you have questions regarding your health or results, please contact the clinic during office hours. You can also ask questions on My Chart.  We at [facility] are grateful that you chose us  to provide your care. We strive to provide evidence-based and compassionate care and are always looking for feedback. If you get a survey from the clinic please complete this so we can hear your opinions.  Healthy Eating, Adult Healthy eating may help you get and keep a healthy body weight, reduce the risk of chronic disease, and live a long and productive life. It is important to follow a healthy eating pattern. Your nutritional and calorie needs should be met mainly by different nutrient-rich foods. What are tips for following this plan? Reading food labels Read labels and choose the following: Reduced or low sodium products. Juices with 100% fruit juice. Foods with low saturated fats (<3 g per serving) and high polyunsaturated and monounsaturated fats. Foods with whole grains, such as whole wheat, cracked wheat, brown rice, and wild rice. Whole grains that are fortified with folic acid. This is recommended for females who are pregnant or who want to  become pregnant. Read labels and do not eat or drink the following: Foods or drinks with added sugars. These include foods that contain brown sugar, corn sweetener, corn syrup, dextrose , fructose, glucose, high-fructose corn syrup, honey, invert sugar, lactose, malt syrup, maltose, molasses, raw sugar, sucrose, trehalose, or turbinado sugar. Limit your intake of added sugars to less than 10% of your total daily calories. Do not eat more than the following amounts of added sugar per day: 6 teaspoons (25 g) for females. 9 teaspoons (38 g) for males. Foods that contain processed or refined starches and grains. Refined grain products, such as white flour, degermed cornmeal, white bread, and white rice. Shopping Choose nutrient-rich snacks, such as vegetables, whole fruits, and nuts. Avoid high-calorie and high-sugar snacks, such as potato chips, fruit snacks, and candy. Use oil-based dressings and spreads on foods instead of solid fats such as butter, margarine, sour cream, or cream cheese. Limit pre-made sauces, mixes, and instant products such as flavored rice, instant noodles, and ready-made pasta. Try more plant-protein sources, such as tofu, tempeh, black beans, edamame, lentils, nuts, and seeds. Explore eating plans such as the Mediterranean diet or vegetarian diet. Try heart-healthy dips made with beans and healthy fats like hummus and guacamole. Vegetables go great with these. Cooking Use oil to saut or stir-fry foods instead of solid fats such as butter, margarine, or lard. Try baking, boiling, grilling, or broiling instead of frying. Remove the fatty part of meats before cooking. Steam vegetables in water  or broth. Meal planning  At meals, imagine dividing your plate into fourths: One-half of  your plate is fruits and vegetables. One-fourth of your plate is whole grains. One-fourth of your plate is protein, especially lean meats, poultry, eggs, tofu, beans, or nuts. Include low-fat  dairy as part of your daily diet. Lifestyle Choose healthy options in all settings, including home, work, school, restaurants, or stores. Prepare your food safely: Wash your hands after handling raw meats. Where you prepare food, keep surfaces clean by regularly washing with hot, soapy water . Keep raw meats separate from ready-to-eat foods, such as fruits and vegetables. Cook seafood, meat, poultry, and eggs to the recommended temperature. Get a food thermometer. Store foods at safe temperatures. In general: Keep cold foods at 84F (4.4C) or below. Keep hot foods at 184F (60C) or above. Keep your freezer at [facility] (-17.8C) or below. Foods are not safe to eat if they have been between the temperatures of 40-184F (4.4-60C) for more than 2 hours. What foods should I eat? Fruits Aim to eat 1-2 cups of fresh, canned (in natural juice), or frozen fruits each day. One cup of fruit equals 1 small apple, 1 large banana, 8 large strawberries, 1 cup (237 g) canned fruit,  cup (82 g) dried fruit, or 1 cup (240 mL) 100% juice. Vegetables Aim to eat 2-4 cups of fresh and frozen vegetables each day, including different varieties and colors. One cup of vegetables equals 1 cup (91 g) broccoli or cauliflower florets, 2 medium carrots, 2 cups (150 g) raw, leafy greens, 1 large tomato, 1 large bell pepper, 1 large sweet potato, or 1 medium white potato. Grains Aim to eat 5-10 ounce-equivalents of whole grains each day. Examples of 1 ounce-equivalent of grains include 1 slice of bread, 1 cup (40 g) ready-to-eat cereal, 3 cups (24 g) popcorn, or  cup (93 g) cooked rice. Meats and other proteins Try to eat 5-7 ounce-equivalents of protein each day. Examples of 1 ounce-equivalent of protein include 1 egg,  oz nuts (12 almonds, 24 pistachios, or 7 walnut halves), 1/4 cup (90 g) cooked beans, 6 tablespoons (90 g) hummus or 1 tablespoon (16 g) peanut butter. A cut of meat or fish that is the size of a deck of  cards is about 3-4 ounce-equivalents (85 g). Of the protein you eat each week, try to have at least 8 sounce (227 g) of seafood. This is about 2 servings per week. This includes salmon, trout, herring, sardines, and anchovies. Dairy Aim to eat 3 cup-equivalents of fat-free or low-fat dairy each day. Examples of 1 cup-equivalent of dairy include 1 cup (240 mL) milk, 8 ounces (250 g) yogurt, 1 ounces (44 g) natural cheese, or 1 cup (240 mL) fortified soy milk. Fats and oils Aim for about 5 teaspoons (21 g) of fats and oils per day. Choose monounsaturated fats, such as canola and olive oils, mayonnaise made with olive oil or avocado oil, avocados, peanut butter, and most nuts, or polyunsaturated fats, such as sunflower, corn, and soybean oils, walnuts, pine nuts, sesame seeds, sunflower seeds, and flaxseed. Beverages Aim for 6 eight-ounce glasses of water  per day. Limit coffee to 3-5 eight-ounce cups per day. Limit caffeinated beverages that have added calories, such as soda and energy drinks. If you drink alcohol: Limit how much you have to: 0-1 drink a day if you are female. 0-2 drinks a day if you are female. Know how much alcohol is in your drink. In the [place] one drink is one 12 oz bottle of beer (355 mL), one 5 oz glass of wine (  148 mL), or one 1 oz glass of hard liquor (44 mL). Seasoning and other foods Try not to add too much salt to your food. Try using herbs and spices instead of salt. Try not to add sugar to food. This information is based on [place] nutrition guidelines. To learn more, visit [employer]. Exact amounts may vary. You may need different amounts. This information is not intended to replace advice given to you by your health care provider. Make sure you discuss any questions you have with your health care provider. Document Revised: 03/17/2022 Document Reviewed: 03/17/2022 Elsevier Patient Education  2024 [employer].

## 2024-07-20 ENCOUNTER — Encounter: Payer: Self-pay | Admitting: Nurse Practitioner

## 2024-07-20 ENCOUNTER — Ambulatory Visit: Admitting: Nurse Practitioner

## 2024-07-20 VITALS — BP 124/80 | HR 76 | Temp 98.0°F | Ht 62.0 in | Wt 174.8 lb

## 2024-07-20 DIAGNOSIS — E538 Deficiency of other specified B group vitamins: Secondary | ICD-10-CM | POA: Diagnosis not present

## 2024-07-20 DIAGNOSIS — E559 Vitamin D deficiency, unspecified: Secondary | ICD-10-CM

## 2024-07-20 DIAGNOSIS — I10 Essential (primary) hypertension: Secondary | ICD-10-CM | POA: Diagnosis not present

## 2024-07-20 DIAGNOSIS — R7989 Other specified abnormal findings of blood chemistry: Secondary | ICD-10-CM

## 2024-07-20 DIAGNOSIS — E7800 Pure hypercholesterolemia, unspecified: Secondary | ICD-10-CM

## 2024-07-20 NOTE — Assessment & Plan Note (Signed)
 Noted on past labs, but was taking Biotin at the time. Recheck today along with antibody. Her current multivitamin does have Biotin.

## 2024-07-20 NOTE — Assessment & Plan Note (Signed)
 Chronic, ongoing. Refuses statin therapy due to concerns about ADR and her husband's past experiences with this. Lipid panel up to date. Could consider Zetia trial if needed in future.

## 2024-07-20 NOTE — Progress Notes (Signed)
 "  BP 124/80 (BP Location: Left Arm, Patient Position: Sitting, Cuff Size: Large)   Pulse 76   Temp 98 F (36.7 C) (Oral)   Ht 5' 2 (1.575 m)   Wt 174 lb 12.8 oz (79.3 kg)   SpO2 97%   BMI 31.97 kg/m    Subjective:    Patient ID: Caitlyn Miranda, female    DOB: 1946-05-14, 79 y.o.   MRN: [id number]  HPI: Caitlyn Miranda is a 79 y.o. female  Chief Complaint  Patient presents with   Hyperlipidemia   Hypertension   Transitioning care over to this PCP after previous provider, Dr. Herold, moved on. Introduced to publishing rights manager role and practice setting. All questions answered. Discussed provider/patient relationship and expectations. Still gets mild headache to anterior aspect right forehead.   Saw Dr. Mittie with ophthalmology, was placed back on steroid drop and vision gradually getting better after shingles.   HYPERTENSION / HYPERLIPIDEMIA Does not take any medications for these. Has some white coat syndrome. Does not like to take medications. Satisfied with current treatment? yes Duration of hypertension: chronic BP monitoring frequency: daily BP range: 120-140/70-80 range, often less then 130/80 BP medication side effects: no Duration of hyperlipidemia: chronic Cholesterol supplements: none - takes Multivitamin Aspirin: no Recent stressors: no Recurrent headaches: no Visual changes: no Palpitations: no Dyspnea: no Chest pain: no Lower extremity edema: no Dizzy/lightheaded: no  The 10-year ASCVD risk score (Arnett DK, et al., 2019) is: 20.7%   Values used to calculate the score:     Age: 59 years     Clinically relevant sex: Female     Is Non-Hispanic African American: No     Diabetic: No     Tobacco smoker: No     Systolic Blood Pressure: 124 mmHg     Is BP treated: No     HDL Cholesterol: 58 mg/dL     Total Cholesterol: 218 mg/dL   ELEVATED TSH Intermittent elevations present. Her multivitamin has Biotin in it. Fatigue: still a little fatigued from  shingles Cold intolerance: no Heat intolerance: no Weight gain: no Weight loss: no Constipation: no Diarrhea/loose stools: no Palpitations: no Lower extremity edema: no Anxiety/depressed mood: no      07/20/2024   10:35 AM 06/27/2024    9:43 AM 06/06/2024    9:02 AM 11/12/2023    2:04 PM 11/19/2022    1:30 PM  Depression screen PHQ 2/9  Decreased Interest 1 0 0 0 0  Down, Depressed, Hopeless 0 0 0 0 0  PHQ - 2 Score 1 0 0 0 0  Altered sleeping 1 0 0 0 1  Tired, decreased energy 1 2 1 2 2   Change in appetite 0 1 0 1 0  Feeling bad or failure about yourself  0 0 0 1 0  Trouble concentrating 0 1 0 1 0  Moving slowly or fidgety/restless 0 0 0 0 0  Suicidal thoughts 0 0 0 0 0  PHQ-9 Score 3 4 1 5  3    Difficult doing work/chores Not difficult at all  Not difficult at all Not difficult at all Not difficult at all     Data saved with a previous flowsheet row definition       07/20/2024   10:36 AM 06/27/2024    9:43 AM 06/06/2024    9:02 AM 11/12/2023    2:05 PM  GAD 7 : Generalized Anxiety Score  Nervous, Anxious, on Edge 1 0  0  1  Control/stop worrying 0 0  0  1   Worry too much - different things 1 0  0  1   Trouble relaxing 0 0  0  1   Restless 0 0  0  0   Easily annoyed or irritable 0 0  0  0   Afraid - awful might happen 0 0  0  0   Total GAD 7 Score 2 0 0 4  Anxiety Difficulty Not difficult at all  Not difficult at all Somewhat difficult     Data saved with a previous flowsheet row definition   Relevant past medical, surgical, family and social history reviewed and updated as indicated. Interim medical history since our last visit reviewed. Allergies and medications reviewed and updated.  Review of Systems  Constitutional:  Negative for activity change, appetite change, diaphoresis, fatigue and fever.  Respiratory:  Negative for cough, chest tightness, shortness of breath and wheezing.   Cardiovascular:  Negative for chest pain, palpitations and leg swelling.   Gastrointestinal: Negative.   Endocrine: Negative for cold intolerance and heat intolerance.  Neurological: Negative.   Psychiatric/Behavioral: Negative.      Per HPI unless specifically indicated above     Objective:    BP 124/80 (BP Location: Left Arm, Patient Position: Sitting, Cuff Size: Large)   Pulse 76   Temp 98 F (36.7 C) (Oral)   Ht 5' 2 (1.575 m)   Wt 174 lb 12.8 oz (79.3 kg)   SpO2 97%   BMI 31.97 kg/m   Wt Readings from Last 3 Encounters:  07/20/24 174 lb 12.8 oz (79.3 kg)  06/27/24 180 lb (81.6 kg)  06/09/24 172 lb 9.9 oz (78.3 kg)    Physical Exam Vitals and nursing note reviewed.  Constitutional:      General: She is awake. She is not in acute distress.    Appearance: She is well-developed and well-groomed. She is obese. She is not ill-appearing or toxic-appearing.  HENT:     Head: Normocephalic.     Right Ear: Hearing and external ear normal.     Left Ear: Hearing and external ear normal.  Eyes:     General: Lids are normal.        Right eye: No discharge.        Left eye: No discharge.     Conjunctiva/sclera: Conjunctivae normal.     Pupils: Pupils are equal, round, and reactive to light.  Neck:     Thyroid : No thyromegaly.     Vascular: No carotid bruit.  Cardiovascular:     Rate and Rhythm: Normal rate and regular rhythm.     Heart sounds: Normal heart sounds. No murmur heard.    No gallop.  Pulmonary:     Effort: Pulmonary effort is normal. No accessory muscle usage or respiratory distress.     Breath sounds: Normal breath sounds. No decreased breath sounds, wheezing or rales.  Abdominal:     General: Bowel sounds are normal. There is no distension.     Palpations: Abdomen is soft.     Tenderness: There is no abdominal tenderness.  Musculoskeletal:     Cervical back: Normal range of motion and neck supple.     Right lower leg: No edema.     Left lower leg: No edema.  Lymphadenopathy:     Cervical: No cervical adenopathy.  Skin:     General: Skin is warm and dry.  Neurological:     Mental Status: She is alert  and oriented to person, place, and time.     Deep Tendon Reflexes: Reflexes are normal and symmetric.     Reflex Scores:      Brachioradialis reflexes are 2+ on the right side and 2+ on the left side.      Patellar reflexes are 2+ on the right side and 2+ on the left side. Psychiatric:        Attention and Perception: Attention normal.        Mood and Affect: Mood normal.        Speech: Speech normal.        Behavior: Behavior normal. Behavior is cooperative.        Thought Content: Thought content normal.     Results for orders placed or performed in visit on 06/27/24  Comprehensive metabolic panel with GFR   Collection Time: 06/27/24  9:53 AM  Result Value Ref Range   Glucose 99 70 - 99 mg/dL   BUN 9 8 - 27 mg/dL   Creatinine, Ser 9.22 0.57 - 1.00 mg/dL   eGFR 79 >40 fO/fpw/8.26   BUN/Creatinine Ratio 12 12 - 28   Sodium 138 134 - 144 mmol/L   Potassium 4.0 3.5 - 5.2 mmol/L   Chloride 102 96 - 106 mmol/L   CO2 18 (L) 20 - 29 mmol/L   Calcium 9.7 8.7 - 10.3 mg/dL   Total Protein 6.7 6.0 - 8.5 g/dL   Albumin 4.5 3.8 - 4.8 g/dL   Globulin, Total 2.2 1.5 - 4.5 g/dL   Bilirubin Total 0.5 0.0 - 1.2 mg/dL   Alkaline Phosphatase 66 49 - 135 IU/L   AST 17 0 - 40 IU/L   ALT 7 0 - 32 IU/L  CBC with Differential/Platelet   Collection Time: 06/27/24  9:53 AM  Result Value Ref Range   WBC 7.7 3.4 - 10.8 x10E3/uL   RBC 4.58 3.77 - 5.28 x10E6/uL   Hemoglobin 13.3 11.1 - 15.9 g/dL   Hematocrit 59.3 65.9 - 46.6 %   MCV 89 79 - 97 fL   MCH 29.0 26.6 - 33.0 pg   MCHC 32.8 31.5 - 35.7 g/dL   RDW 86.0 88.2 - 84.5 %   Platelets 437 150 - 450 x10E3/uL   Neutrophils 48 Not Estab. %   Lymphs 41 Not Estab. %   Monocytes 8 Not Estab. %   Eos 2 Not Estab. %   Basos 1 Not Estab. %   Neutrophils Absolute 3.7 1.4 - 7.0 x10E3/uL   Lymphocytes Absolute 3.2 (H) 0.7 - 3.1 x10E3/uL   Monocytes Absolute 0.6 0.1 - 0.9  x10E3/uL   EOS (ABSOLUTE) 0.1 0.0 - 0.4 x10E3/uL   Basophils Absolute 0.1 0.0 - 0.2 x10E3/uL   Immature Granulocytes 0 Not Estab. %   Immature Grans (Abs) 0.0 0.0 - 0.1 x10E3/uL  Lipid Panel w/o Chol/HDL Ratio   Collection Time: 06/27/24  9:53 AM  Result Value Ref Range   Cholesterol, Total 218 (H) 100 - 199 mg/dL   Triglycerides 805 (H) 0 - 149 mg/dL   HDL 58 >60 mg/dL   VLDL Cholesterol Cal 34 5 - 40 mg/dL   LDL Chol Calc (NIH) 873 (H) 0 - 99 mg/dL  TSH   Collection Time: 06/27/24  9:53 AM  Result Value Ref Range   TSH 4.820 (H) 0.450 - 4.500 uIU/mL  T4, free   Collection Time: 06/27/24  9:53 AM  Result Value Ref Range   Free T4 1.00 0.82 - 1.77 ng/dL  Assessment & Plan:   Problem List Items Addressed This Visit       Cardiovascular and Mediastinum   Hypertension - Primary   Chronic with no current medications. BP at goal for age today on recheck. Will continue to monitor. Did not tolerate Losartan  or Lisinopril  in past. Recommend she monitor BP at least a few mornings a week at home and document.  DASH diet at home. Restart medication as needed, possibly Amlodipine. Labs today: up to date.         Other   Vitamin D  deficiency   Chronic, stable. Recheck level today and continue supplement at home.      Relevant Orders   VITAMIN D  25 Hydroxy (Vit-D Deficiency, Fractures)   Hypercholesteremia   Chronic, ongoing. Refuses statin therapy due to concerns about ADR and her husband's past experiences with this. Lipid panel up to date. Could consider Zetia trial if needed in future.      Elevated TSH   Noted on past labs, but was taking Biotin at the time. Recheck today along with antibody. Her current multivitamin does have Biotin.      Relevant Orders   T4, free   Thyroid  peroxidase antibody   TSH   Other Visit Diagnoses       B12 deficiency       History of low levels reported in past, recheck today.   Relevant Orders   Vitamin B12        Follow up  plan: Return in about 6 months (around 01/17/2025) for HTN/HLD, Vit D, TSH CHECK.      "

## 2024-07-20 NOTE — Assessment & Plan Note (Signed)
 Chronic with no current medications. BP at goal for age today on recheck. Will continue to monitor. Did not tolerate Losartan  or Lisinopril  in past. Recommend she monitor BP at least a few mornings a week at home and document.  DASH diet at home. Restart medication as needed, possibly Amlodipine. Labs today: up to date.

## 2024-07-20 NOTE — Assessment & Plan Note (Signed)
 Chronic, stable. Recheck level today and continue supplement at home.

## 2024-07-21 ENCOUNTER — Ambulatory Visit: Payer: Self-pay | Admitting: Nurse Practitioner

## 2024-07-21 LAB — VITAMIN B12: Vitamin B-12: 490 pg/mL (ref 232–1245)

## 2024-07-21 LAB — T4, FREE: Free T4: 0.93 ng/dL (ref 0.82–1.77)

## 2024-07-21 LAB — THYROID PEROXIDASE ANTIBODY: Thyroperoxidase Ab SerPl-aCnc: <9 [IU]/mL (ref 0–34)

## 2024-07-21 LAB — VITAMIN D 25 HYDROXY (VIT D DEFICIENCY, FRACTURES): Vit D, 25-Hydroxy: 21.5 ng/mL — ABNORMAL LOW (ref 30.0–100.0)

## 2024-07-21 LAB — TSH: TSH: 3.93 u[IU]/mL (ref 0.450–4.500)

## 2024-07-21 NOTE — Progress Notes (Signed)
 Contacted via MyChart  Good morning Caitlyn Miranda, your labs have returned. Thyroid  levels are stable this check. Vitamin D  is a little low, ensure you are taking Vitamin D3 2000 units daily for bone health. B12 level has trended up, if taking supplement continue this. Any questions? Keep being amazing!!  Thank you for allowing me to participate in your care.  I appreciate you. Kindest regards, Onyx Edgley

## 2024-08-11 ENCOUNTER — Ambulatory Visit

## 2025-01-18 ENCOUNTER — Ambulatory Visit: Admitting: Nurse Practitioner
